# Patient Record
Sex: Male | Born: 1965 | Race: White | Hispanic: No | Smoking: Current every day smoker
Health system: Southern US, Community
[De-identification: ages and names within clinical notes are randomized; demographics above are authoritative.]

## PROBLEM LIST (undated history)

## (undated) DIAGNOSIS — G459 Transient cerebral ischemic attack, unspecified: Secondary | ICD-10-CM

## (undated) DIAGNOSIS — I499 Cardiac arrhythmia, unspecified: Secondary | ICD-10-CM

## (undated) DIAGNOSIS — F1021 Alcohol dependence, in remission: Secondary | ICD-10-CM

## (undated) DIAGNOSIS — I10 Essential (primary) hypertension: Secondary | ICD-10-CM

## (undated) DIAGNOSIS — K5792 Diverticulitis of intestine, part unspecified, without perforation or abscess without bleeding: Secondary | ICD-10-CM

## (undated) DIAGNOSIS — I639 Cerebral infarction, unspecified: Secondary | ICD-10-CM

## (undated) DIAGNOSIS — R569 Unspecified convulsions: Secondary | ICD-10-CM

## (undated) DIAGNOSIS — W3400XA Accidental discharge from unspecified firearms or gun, initial encounter: Secondary | ICD-10-CM

## (undated) DIAGNOSIS — R030 Elevated blood-pressure reading, without diagnosis of hypertension: Secondary | ICD-10-CM

## (undated) DIAGNOSIS — IMO0001 Reserved for inherently not codable concepts without codable children: Secondary | ICD-10-CM

## (undated) DIAGNOSIS — Z8489 Family history of other specified conditions: Secondary | ICD-10-CM

## (undated) HISTORY — PX: FINGER SURGERY: SHX640

## (undated) HISTORY — PX: OTHER SURGICAL HISTORY: SHX169

## (undated) HISTORY — DX: Diverticulitis of intestine, part unspecified, without perforation or abscess without bleeding: K57.92

## (undated) HISTORY — DX: Reserved for inherently not codable concepts without codable children: IMO0001

## (undated) HISTORY — PX: EYE SURGERY: SHX253

## (undated) HISTORY — DX: Transient cerebral ischemic attack, unspecified: G45.9

## (undated) HISTORY — DX: Elevated blood-pressure reading, without diagnosis of hypertension: R03.0

---

## 2004-11-27 ENCOUNTER — Ambulatory Visit (HOSPITAL_COMMUNITY): Admission: RE | Admit: 2004-11-27 | Discharge: 2004-11-27 | Payer: Self-pay | Admitting: Family Medicine

## 2010-04-06 ENCOUNTER — Ambulatory Visit (HOSPITAL_COMMUNITY)
Admission: RE | Admit: 2010-04-06 | Discharge: 2010-04-06 | Payer: Self-pay | Source: Home / Self Care | Attending: Family Medicine | Admitting: Family Medicine

## 2012-11-21 ENCOUNTER — Ambulatory Visit (INDEPENDENT_AMBULATORY_CARE_PROVIDER_SITE_OTHER): Payer: BC Managed Care – PPO | Admitting: Family Medicine

## 2012-11-21 ENCOUNTER — Encounter: Payer: Self-pay | Admitting: Family Medicine

## 2012-11-21 ENCOUNTER — Ambulatory Visit (INDEPENDENT_AMBULATORY_CARE_PROVIDER_SITE_OTHER): Payer: BC Managed Care – PPO

## 2012-11-21 VITALS — BP 148/95 | HR 73 | Temp 97.4°F | Ht 70.0 in | Wt 210.0 lb

## 2012-11-21 DIAGNOSIS — R5381 Other malaise: Secondary | ICD-10-CM

## 2012-11-21 DIAGNOSIS — R5383 Other fatigue: Secondary | ICD-10-CM

## 2012-11-21 LAB — CBC WITH DIFFERENTIAL/PLATELET
Basophils Absolute: 0 10*3/uL (ref 0.0–0.1)
Basophils Relative: 0 % (ref 0–1)
MCHC: 34.5 g/dL (ref 30.0–36.0)
Neutro Abs: 3.5 10*3/uL (ref 1.7–7.7)
Neutrophils Relative %: 53 % (ref 43–77)
RDW: 14.3 % (ref 11.5–15.5)

## 2012-11-21 NOTE — Progress Notes (Signed)
  Subjective:    Patient ID: Michael Herring, male    DOB: [birth year], 47 y.o.   MRN: [id number]  HPI  Pt presents today with chief complaint of fatigue.  Pt states that he has had generalzed fatigue over the last 3 months.  He reports that he started working a 3rd shift job about 9 months ago.  Pt was otherwise asymptomatic prior to this.  Sxs include generalized fatigue  Only sleeps 2-3 hours per night  Mild weakness Has also decreased libido  Has been under stress.  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 Says he can lose up to 5 pounds in 1 shift.  No night sweats per report.  No unintentional weight loss apart from work.  Smoke 1 1/2 PPD x 42 years. Minimal cough.  Drinks up to 3 beers 3 times per week.  No prior hx/o depression.  No illicit drug abuse.     Review of Systems  All other systems reviewed and are negative.       Objective:   Physical Exam  Constitutional: He appears well-developed.  Fatigued appearing    HENT:  Head: Normocephalic and atraumatic.  Right Ear: External ear normal.  Left Ear: External ear normal.  Eyes: Conjunctivae are normal. Pupils are equal, round, and reactive to light.  Neck: Normal range of motion.  Cardiovascular: Normal rate and regular rhythm.   Pulmonary/Chest: Effort normal and breath sounds normal.  Abdominal: Soft.  Musculoskeletal: Normal range of motion.  Lymphadenopathy:    He has no cervical adenopathy.  Neurological: He is alert.  Skin: Skin is warm.   [facility] reading (PRIMARY) by  Dr. Alvester Morin CXR preliminarily negative for any focal lesions. Mild bronchitic changes.                                          Assessment & Plan:  Other malaise and fatigue - Plan: CBC with Differential, TSH, POCT glycosylated hemoglobin (Hb A1C), Comprehensive metabolic panel, Vitamin D pnl(25-hydrxy+1,25-dihy)-bld, Vitamin B12, Testosterone, free, total, DG Chest 2 View, [place] spotted fvr abs pnl(IgG+IgM),  Sedimentation Rate, CK total and CKMB, B. burgdorfi antibodies, CANCELED: LYME IGG/IGM AB  DDx for sxs is fairly broad including sleep deprivation, vitamin deficiency, mood, inflammatory process, infection, malignancy. Will check labs as above. CXR preliminarily without any focal lesions. Will hold off on any particular treatment pending workup. Discussed general red flags.

## 2012-11-22 LAB — COMPREHENSIVE METABOLIC PANEL
ALT: 19 U/L (ref 0–53)
Alkaline Phosphatase: 59 U/L (ref 39–117)
Glucose, Bld: 84 mg/dL (ref 70–99)
Sodium: 139 mEq/L (ref 135–145)
Total Bilirubin: 0.4 mg/dL (ref 0.3–1.2)
Total Protein: 6.6 g/dL (ref 6.0–8.3)

## 2012-11-22 LAB — TESTOSTERONE, FREE, TOTAL, SHBG
Testosterone-% Free: 1.7 % (ref 1.6–2.9)
Testosterone: 645 ng/dL (ref 300–890)

## 2012-11-22 LAB — CK TOTAL AND CKMB (NOT AT ARMC)
Relative Index: 3.4 — ABNORMAL HIGH (ref 0.0–2.5)
Total CK: 122 U/L (ref 7–232)

## 2012-11-22 LAB — VITAMIN B12: Vitamin B-12: 218 pg/mL (ref 211–911)

## 2012-11-22 LAB — TSH: TSH: 2.29 u[IU]/mL (ref 0.350–4.500)

## 2012-11-23 LAB — B. BURGDORFI ANTIBODIES: B burgdorferi Ab IgG+IgM: 0.18 {ISR}

## 2012-11-24 LAB — VITAMIN D PNL(25-HYDRXY+1,25-DIHY)-BLD
Vit D, 25-Hydroxy: 35 ng/mL (ref 30–89)
Vitamin D3 1, 25 (OH)2: 53 pg/mL

## 2012-11-28 ENCOUNTER — Ambulatory Visit (INDEPENDENT_AMBULATORY_CARE_PROVIDER_SITE_OTHER): Payer: BC Managed Care – PPO | Admitting: Family Medicine

## 2012-11-28 ENCOUNTER — Encounter: Payer: Self-pay | Admitting: Family Medicine

## 2012-11-28 VITALS — BP 130/81 | HR 72 | Temp 99.3°F | Ht 70.0 in | Wt 211.0 lb

## 2012-11-28 DIAGNOSIS — G4726 Circadian rhythm sleep disorder, shift work type: Secondary | ICD-10-CM

## 2012-11-28 DIAGNOSIS — R5381 Other malaise: Secondary | ICD-10-CM

## 2012-11-28 DIAGNOSIS — N419 Inflammatory disease of prostate, unspecified: Secondary | ICD-10-CM

## 2012-11-28 DIAGNOSIS — N529 Male erectile dysfunction, unspecified: Secondary | ICD-10-CM

## 2012-11-28 DIAGNOSIS — R5383 Other fatigue: Secondary | ICD-10-CM

## 2012-11-28 MED ORDER — SILDENAFIL CITRATE 100 MG PO TABS: 50.0000 mg | ORAL_TABLET | Freq: Every day | ORAL | Status: DC | PRN

## 2012-11-28 MED ORDER — CIPROFLOXACIN HCL 500 MG PO TABS: 500.0000 mg | ORAL_TABLET | Freq: Two times a day (BID) | ORAL | Status: DC

## 2012-11-28 NOTE — Progress Notes (Signed)
  Subjective:    Patient ID: Michael Herring, male    DOB: 05-04-1965, 47 y.o.   MRN: [id number]  HPI Patient presents today for followup of fatigue. Patient seen last week for persistent fatigue over the past 9 months. Patient had a fairly extensive workup performed with blood work including TSH, cemented, vitamin B12 and vitamin D levels. Reck you Monospot a fever as well as Lyme titers also performed given body aches. Sedimentation rate CK level also check. Laboratories essentially negative. Predominant issue has been the patient started a third shift job about 9 months ago has had significant problems with sleep since this point. Patient has only slept 2-4 hours per day and feels overall very fatigue. This is also affecting his sex life. Patient is a somewhat difficult time maintaining erection since he started his nocturnal job. Patient denies any true mood issues. Patient denies any issue depression. No prior history of mood issues.   Patient also reports having one episode of hematospermia over the weekend. No back pain, no fever, no dysuria. Patient states this is never happened before.   Review of Systems  All other systems reviewed and are negative.       Objective:   Physical Exam  Constitutional: He appears well-developed and well-nourished.  HENT:  Head: Normocephalic and atraumatic.  Eyes: Conjunctivae are normal. Pupils are equal, round, and reactive to light.  Neck: Normal range of motion.  Cardiovascular: Normal rate and regular rhythm.   Pulmonary/Chest: Effort normal and breath sounds normal.  Abdominal: Soft.  Genitourinary:  + prostatic tenderness   Musculoskeletal: Normal range of motion.  Neurological: He is alert.  Skin: Skin is warm.          Assessment & Plan:  Prostatitis - Plan: ciprofloxacin (CIPRO) 500 MG tablet, PSA, total and free, Urine culture  Erectile dysfunction - Plan: sildenafil (VIAGRA) 100 MG tablet  Shift work sleep  disorder  Fatigue  Had a relatively lengthy discussion with patient about treatment options. Symptoms seem to be more so related to shift work related sleep issues versus mood. Workup including multiple labs are within normal limits. Will place patient on course of melatonin for the next 2 weeks to see this improves symptoms. Discussed that if symptoms do not improve with melatonin may consider prescription medications such as trazodone or Remeron. Patient's PHQ 9 is around 10 today. Symptoms seem to be predominantly related to sleep and fatigue. Do not feel symptoms are related to depression.  When necessary Viagra prescription also given. Testosterone level within normal limits recently. No sexual dysfunction issues prior to starting third shift job. Suspect that erectile dysfunction issues are more so related to fatigue and sleep disorder. Patient will like a prescription just in case.  Start patient on extended course of Cipro for prostatitis. Will check PSA as well as a urine culture. Discussed GU red flags.   Follow up as needed.

## 2012-11-28 NOTE — Patient Instructions (Addendum)
Prostatitis The prostate gland is about the size and shape of a walnut. It is located just below your bladder. It produces one of the components of semen, which is made up of sperm and the fluids that help nourish and transport it out from the testicles. Prostatitis is redness, soreness, and swelling (inflammation) of the prostate gland.  There are 3 types of prostatitis:  Acute bacterial prostatitis This is the least common type of prostatitis. It starts quickly and usually leads to a bladder infection. It can occur at any age.  Chronic bacterial prostatitis This is a persistent bacterial infection in the prostate. It usually develops from repeated acute bacterial prostatitis or acute bacterial prostatitis that was not properly treated. It can occur in men of any age but is most common in middle-aged men whose prostate has begun to enlarge.   Chronic prostatitis chronic pelvic pain syndrome This is the most common type of prostatitis. It is inflammation of the prostate gland that is not caused by a bacterial infection. The cause is unknown. CAUSES The cause of acute and chronic bacterial prostatitis is a bacterial infection. The exact cause of chronic prostatitis and chronic pelvic pain syndrome and asymptomatic inflammatory prostatitis is unknown.  SYMPTOMS  Symptoms can vary depending upon the type of prostatitis that exists. There can also be overlap in symptoms. Possible symptoms for each type of prostatitis are listed below. Acute bacterial prostatitis  Painful urination.  Fever or chills.  Muscle or joint pains.  Low back pain.  Low abdominal pain.  Inability to empty bladder completely.  Sudden urge to urinate.  Frequent urination.  Difficulty starting urine stream.  Weak urine stream.  Discharge from the urethra.  Dribbling after urination.  Rectal pain.  Pain in the testicles, penis, or tip of the penis.  Pain in the space between the anus and scrotum  (perineum).  Problems with sexual function.  Painful ejaculation.  Bloody semen. Chronic bacterial prostatitis  The symptoms are similar to those of acute bacterial prostatitis, but they usually are much less severe. Fever, chills, and muscle and joint pain are not associated with chronic bacterial prostatitis. Chronic prostatitis chronic pelvic pain syndrome  Symptoms typically include a dull ache in the scrotum and the perineum. DIAGNOSIS  In order to diagnose prostatitis, your caregiver will ask about your symptoms. If acute or chronic bacterial prostatitis is suspected, a urine sample will be taken and tested (urinalysis). This is to see if there is bacteria in your urine. If the urinalysis result is negative for bacteria, your caregiver may use a finger to feel your prostate (digital rectal exam). This exam helps your caregiver determine if your prostate is swollen and tender. TREATMENT  Treatment for prostatitis depends on the cause. If a bacterial infection is the cause, it can be treated with antibiotic medicine. In cases of chronic bacterial prostatitis, the use of antibiotics for up to 1 month may be necessary. Your caregiver may instruct you to take sitz baths to help relieve pain. A sitz bath is a bath of hot water in which your hips and buttocks are under water. HOME CARE INSTRUCTIONS   Take all medicines as directed by your caregiver.  Take sitz baths as directed by your caregiver. SEEK MEDICAL CARE IF:   Your symptoms get worse, not better.  You have a fever. SEEK IMMEDIATE MEDICAL CARE IF:   You have chills.  You feel nauseous or vomit.  You feel lightheaded or faint.  You are unable to  urinate.  You have blood or blood clots in your urine. Document Released: 04/16/2000 Document Revised: 07/12/2011 Document Reviewed: 03/22/2011 [facility] Patient Information 2014 [place], [place].   Purchase melatonin over-the-counter to help with sleep.

## 2012-11-29 ENCOUNTER — Telehealth: Payer: Self-pay | Admitting: Family Medicine

## 2012-11-29 LAB — URINE CULTURE

## 2012-11-29 LAB — PSA, TOTAL AND FREE: PSA: 0.4 ng/mL (ref 0.0–4.0)

## 2012-12-07 ENCOUNTER — Encounter: Payer: Self-pay | Admitting: *Deleted

## 2012-12-27 ENCOUNTER — Other Ambulatory Visit: Payer: Self-pay

## 2012-12-27 ENCOUNTER — Emergency Department (HOSPITAL_COMMUNITY)
Admission: EM | Admit: 2012-12-27 | Discharge: 2012-12-27 | Disposition: A | Discharge status: Home / Self Care | Payer: BC Managed Care – PPO | Attending: Emergency Medicine | Admitting: Emergency Medicine

## 2012-12-27 ENCOUNTER — Encounter (HOSPITAL_COMMUNITY): Payer: Self-pay | Admitting: *Deleted

## 2012-12-27 ENCOUNTER — Emergency Department (HOSPITAL_COMMUNITY): Payer: BC Managed Care – PPO

## 2012-12-27 DIAGNOSIS — R05 Cough: Secondary | ICD-10-CM | POA: Insufficient documentation

## 2012-12-27 DIAGNOSIS — R42 Dizziness and giddiness: Secondary | ICD-10-CM | POA: Insufficient documentation

## 2012-12-27 DIAGNOSIS — R059 Cough, unspecified: Secondary | ICD-10-CM | POA: Insufficient documentation

## 2012-12-27 DIAGNOSIS — R079 Chest pain, unspecified: Secondary | ICD-10-CM

## 2012-12-27 DIAGNOSIS — F17200 Nicotine dependence, unspecified, uncomplicated: Secondary | ICD-10-CM | POA: Insufficient documentation

## 2012-12-27 LAB — CBC WITH DIFFERENTIAL/PLATELET
Basophils Absolute: 0 10*3/uL (ref 0.0–0.1)
Basophils Relative: 0 % (ref 0–1)
Eosinophils Relative: 3 % (ref 0–5)
HCT: 43.9 % (ref 39.0–52.0)
Hemoglobin: 15.7 g/dL (ref 13.0–17.0)
MCH: 31.4 pg (ref 26.0–34.0)
MCHC: 35.8 g/dL (ref 30.0–36.0)
MCV: 87.8 fL (ref 78.0–100.0)
Monocytes Absolute: 0.8 10*3/uL (ref 0.1–1.0)
Monocytes Relative: 10 % (ref 3–12)
RDW: 13 % (ref 11.5–15.5)

## 2012-12-27 LAB — BASIC METABOLIC PANEL
BUN: 11 mg/dL (ref 6–23)
CO2: 25 mEq/L (ref 19–32)
Chloride: 104 mEq/L (ref 96–112)
Creatinine, Ser: 0.86 mg/dL (ref 0.50–1.35)
GFR calc Af Amer: >90 mL/min (ref >90)
Glucose, Bld: 114 mg/dL — ABNORMAL HIGH (ref 70–99)

## 2012-12-27 MED ORDER — NITROGLYCERIN 0.4 MG SL SUBL
0.4000 mg | SUBLINGUAL_TABLET | Freq: Once | SUBLINGUAL | Status: AC
  Administered 2012-12-27: 0.4 mg via SUBLINGUAL

## 2012-12-27 MED ORDER — OXYCODONE-ACETAMINOPHEN 5-325 MG PO TABS
2.0000 | ORAL_TABLET | Freq: Once | ORAL | Status: AC
  Administered 2012-12-27: 2 via ORAL
  Filled 2012-12-27: qty 2

## 2012-12-27 NOTE — ED Notes (Addendum)
PAIN - wants something for headache.  Chest pain much better after NTG x 2

## 2012-12-27 NOTE — ED Notes (Signed)
Pt reporting pain in center of chest moving into back and left shoulder.  Reports coughing, but no vomiting.  Denies SOB.

## 2012-12-27 NOTE — ED Notes (Signed)
Took two aspirin (adult in package at work) around 02:00 - 02:20 am while at work.

## 2012-12-27 NOTE — ED Notes (Signed)
Patient reports about 20 minutes after pain began he started sweating and felt a little dizzy.  On arrival to ED, skin warm and dry.  Pain is still to left of sternum, patient presses on area to "try to make it feel better", movement does not increase pain.

## 2012-12-27 NOTE — ED Notes (Signed)
Up to void - now states he feels a soreness in area where pain was earlier.
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
## 2013-01-03 ENCOUNTER — Emergency Department (HOSPITAL_COMMUNITY)
Admission: EM | Admit: 2013-01-03 | Discharge: 2013-01-03 | Disposition: A | Discharge status: Home / Self Care | Payer: Worker's Compensation | Attending: Emergency Medicine | Admitting: Emergency Medicine

## 2013-01-03 ENCOUNTER — Encounter (HOSPITAL_COMMUNITY): Payer: Self-pay | Admitting: *Deleted

## 2013-01-03 DIAGNOSIS — W230XXA Caught, crushed, jammed, or pinched between moving objects, initial encounter: Secondary | ICD-10-CM | POA: Insufficient documentation

## 2013-01-03 DIAGNOSIS — S61409A Unspecified open wound of unspecified hand, initial encounter: Secondary | ICD-10-CM | POA: Insufficient documentation

## 2013-01-03 DIAGNOSIS — Y929 Unspecified place or not applicable: Secondary | ICD-10-CM | POA: Insufficient documentation

## 2013-01-03 DIAGNOSIS — Y9389 Activity, other specified: Secondary | ICD-10-CM | POA: Insufficient documentation

## 2013-01-03 DIAGNOSIS — F17200 Nicotine dependence, unspecified, uncomplicated: Secondary | ICD-10-CM | POA: Insufficient documentation

## 2013-01-03 DIAGNOSIS — Y990 Civilian activity done for income or pay: Secondary | ICD-10-CM | POA: Insufficient documentation

## 2013-01-03 DIAGNOSIS — IMO0002 Reserved for concepts with insufficient information to code with codable children: Secondary | ICD-10-CM

## 2013-01-03 DIAGNOSIS — Z792 Long term (current) use of antibiotics: Secondary | ICD-10-CM | POA: Insufficient documentation

## 2013-01-03 MED ORDER — BACITRACIN-NEOMYCIN-POLYMYXIN 400-5-5000 EX OINT
TOPICAL_OINTMENT | CUTANEOUS | Status: AC
  Administered 2013-01-03: 03:00:00
  Filled 2013-01-03: qty 1

## 2013-01-03 MED ORDER — LIDOCAINE HCL (PF) 1 % IJ SOLN
INTRAMUSCULAR | Status: AC
  Filled 2013-01-03: qty 5

## 2013-01-03 NOTE — ED Notes (Signed)
Lac noted in between fourth & fifth finger. Bleeding controlled.

## 2013-01-03 NOTE — ED Notes (Signed)
Pt working on a machine at work & raised his right hand up into a razor blade.

## 2013-01-03 NOTE — ED Notes (Signed)
Pt carried to lab for UDS. Lab aware of the need for the UDS.

## 2013-01-03 NOTE — ED Provider Notes (Signed)
TIME SEEN: 2:34 AM  CHIEF COMPLAINT: Right hand laceration  HPI: Patient is a 47 y.o. male with no significant past medical history presents emergency department with a laceration to his right dorsal hand. He reports he was at work tonight when it got caught in machine. Denies any other injury. States his last tetanus vaccination was 2 years ago. No numbness, tingling or focal weakness. He is right-hand dominant.  ROS: See HPI Constitutional: no fever  Eyes: no drainage  ENT: no runny nose   Cardiovascular:  no chest pain  Resp: no SOB  GI: no vomiting GU: no dysuria Integumentary: no rash  Allergy: no hives  Musculoskeletal: no leg swelling  Neurological: no slurred speech ROS otherwise negative  PAST MEDICAL HISTORY/PAST SURGICAL HISTORY:  No past medical history on file.  MEDICATIONS:  Prior to Admission medications   Medication Sig Start Date End Date Taking? Authorizing Provider  ciprofloxacin (CIPRO) 500 MG tablet Take 1 tablet (500 mg total) by mouth 2 (two) times daily. 11/28/12   Doree Albee, MD  sildenafil (VIAGRA) 100 MG tablet Take 0.5-1 tablets (50-100 mg total) by mouth daily as needed for erectile dysfunction. 11/28/12   Doree Albee, MD    ALLERGIES:  No Known Allergies  SOCIAL HISTORY:  History  Substance Use Topics  . Smoking status: Current Every Day Smoker -- 1.50 packs/day for 28 years    Types: Cigarettes  . Smokeless tobacco: Not on file  . Alcohol Use: 3.6 oz/week    6 Cans of beer per week    FAMILY HISTORY: Family History  Problem Relation Age of Onset  . Hyperlipidemia Father   . COPD Father     EXAM: There were no vitals taken for this visit. CONSTITUTIONAL: Alert and oriented and responds appropriately to questions. Well-appearing; well-nourished; GCS 15 HEAD: Normocephalic; atraumatic EYES: Conjunctivae clear, PERRL, EOMI ENT: normal nose; no rhinorrhea; moist mucous membranes; pharynx without lesions noted; no dental injury; no  hemotypanum; no septal hematoma NECK: Supple, no meningismus, no LAD; no midline spinal tenderness, step-off or deformity CARD: RRR; S1 and S2 appreciated; no murmurs, no clicks, no rubs, no gallops RESP: Normal chest excursion without splinting or tachypnea; breath sounds clear and equal bilaterally; no wheezes, no rhonchi, no rales; chest wall stable, nontender to palpation ABD/GI: Normal bowel sounds; non-distended; soft, non-tender, no rebound, no guarding PELVIS:  stable, nontender to palpation BACK:  The back appears normal and is non-tender to palpation, there is no CVA tenderness; no midline spinal tenderness, step-off or deformity EXT: Patient has a 2.5 cm superficial laceration to the dorsal hand between the fourth and fifth digits, no tendon involvement, sensation to light touch intact diffusely, 2+ radial pulses bilaterally, normal capillary refill ; Normal ROM in all joints; non-tender to palpation; no edema; normal capillary refill; no cyanosis    SKIN: Normal color for age and race; warm NEURO: Moves all extremities equally PSYCH: The patient's mood and manner are appropriate. Grooming and personal hygiene are appropriate.  MEDICAL DECISION MAKING: Patient with superficial laceration. I repeated wound and repaired with one 5.0 Prolene simple interrupted suture. Patient's tetanus is up-to-date. Applied sterile dressing and bacitracin ointment. We'll give return precautions and PCP followup. Patient verbalizes understanding is comfortable plan.    LACERATION REPAIR Performed by: Raelyn Number Authorized by: Raelyn Number Consent: Verbal consent obtained. Risks and benefits: risks, benefits and alternatives were discussed Consent given by: patient Patient identity confirmed: provided demographic data Prepped and Draped in normal  sterile fashion Wound explored  Laceration Location: Dorsal right hand  Laceration Length: 2.5 cm  No Foreign Bodies seen or  palpated  Anesthesia: None   Local anesthetic: None   Anesthetic total: 0 ml  Irrigation method: syringe Amount of cleaning: standard  Skin closure: Simple, 5.0 Prolene   Number of sutures: 1 simple interrupted   Technique: Wound was irrigated with saline. Area cleansed with Betadine and prepped with sterile dressing. One 5.0 Prolene interrupted suture placed. Estrace ointment applied and sterile dressing applied.   Patient tolerance: Patient tolerated the procedure well with no immediate complications.  Layla Maw Ward, DO 01/03/13 0302

## 2014-02-11 ENCOUNTER — Encounter: Payer: Self-pay | Admitting: Family Medicine

## 2014-02-11 ENCOUNTER — Telehealth: Payer: Self-pay | Admitting: Family Medicine

## 2014-02-11 ENCOUNTER — Ambulatory Visit (INDEPENDENT_AMBULATORY_CARE_PROVIDER_SITE_OTHER): Payer: BC Managed Care – PPO | Admitting: Family Medicine

## 2014-02-11 ENCOUNTER — Encounter: Payer: Self-pay | Admitting: *Deleted

## 2014-02-11 VITALS — BP 134/88 | HR 80 | Temp 97.9°F | Ht 70.0 in | Wt 219.0 lb

## 2014-02-11 DIAGNOSIS — J029 Acute pharyngitis, unspecified: Secondary | ICD-10-CM

## 2014-02-11 DIAGNOSIS — R52 Pain, unspecified: Secondary | ICD-10-CM

## 2014-02-11 DIAGNOSIS — R059 Cough, unspecified: Secondary | ICD-10-CM

## 2014-02-11 DIAGNOSIS — R0989 Other specified symptoms and signs involving the circulatory and respiratory systems: Secondary | ICD-10-CM

## 2014-02-11 DIAGNOSIS — R509 Fever, unspecified: Secondary | ICD-10-CM

## 2014-02-11 DIAGNOSIS — R05 Cough: Secondary | ICD-10-CM

## 2014-02-11 DIAGNOSIS — J208 Acute bronchitis due to other specified organisms: Secondary | ICD-10-CM

## 2014-02-11 LAB — POCT INFLUENZA A/B
Influenza A, POC: NEGATIVE
Influenza B, POC: NEGATIVE

## 2014-02-11 LAB — POCT RAPID STREP A (OFFICE): Rapid Strep A Screen: NEGATIVE

## 2014-02-11 MED ORDER — ALBUTEROL SULFATE HFA 108 (90 BASE) MCG/ACT IN AERS: 2.0000 | INHALATION_SPRAY | Freq: Four times a day (QID) | RESPIRATORY_TRACT | Status: DC | PRN

## 2014-02-11 MED ORDER — METHYLPREDNISOLONE ACETATE 80 MG/ML IJ SUSP
80.0000 mg | Freq: Once | INTRAMUSCULAR | Status: AC
  Administered 2014-02-11: 80 mg via INTRAMUSCULAR

## 2014-02-11 MED ORDER — HYDROCODONE-HOMATROPINE 5-1.5 MG/5ML PO SYRP: 5.0000 mL | ORAL_SOLUTION | Freq: Three times a day (TID) | ORAL | Status: DC | PRN

## 2014-02-11 MED ORDER — AZITHROMYCIN 250 MG PO TABS: ORAL_TABLET | ORAL | Status: DC

## 2014-02-11 NOTE — Progress Notes (Signed)
   Subjective:    Patient ID: Michael Herring, male    DOB: 1965/06/01, 48 y.o.   MRN: [id number]  HPI This 49 y.o. male presents for evaluation of URI sx's.  He has a persistent cough.  He has been hurting in the chest due to coughing so much.  He is a smoker.   Review of Systems C/o cough and uri sx's No chest pain, SOB, HA, dizziness, vision change, N/V, diarrhea, constipation, dysuria, urinary urgency or frequency, myalgias, arthralgias or rash.     Objective:   Physical Exam  Vital signs noted  Well developed well nourished male.  HEENT - Head atraumatic Normocephalic                Eyes - PERRLA, Conjuctiva - clear Sclera- Clear EOMI                Ears - EAC's Wnl TM's Wnl Gross Hearing WNL                Nose - Nares patent                 Throat - oropharanx wnl Respiratory - Lungs CTA bilateral Cardiac - RRR S1 and S2 without murmur GI - Abdomen soft Nontender and bowel sounds active x 4 Extremities - No edema. Neuro - Grossly intact.      Assessment & Plan:  Cough - Plan: POCT Influenza A/B  Chest congestion - Plan: POCT Influenza A/B  Body aches - Plan: POCT Influenza A/B, POCT rapid strep A  Sore throat - Plan: POCT rapid strep A  Fever and chills - Plan: POCT Influenza A/B, POCT rapid strep A  Acute bronchitis due to other specified organisms - Plan: azithromycin (ZITHROMAX) 250 MG tablet, HYDROcodone-homatropine (HYCODAN) 5-1.5 MG/5ML syrup, albuterol (PROVENTIL HFA;VENTOLIN HFA) 108 (90 BASE) MCG/ACT inhaler, methylPREDNISolone acetate (DEPO-MEDROL) injection 80 mg  Push po fluids, rest, tylenol and motrin otc prn as directed for fever, arthralgias, and myalgias.  Follow up prn if sx's continue or persist.  Lysbeth Penner FNP

## 2014-10-16 ENCOUNTER — Encounter (INDEPENDENT_AMBULATORY_CARE_PROVIDER_SITE_OTHER): Payer: Self-pay

## 2014-10-16 ENCOUNTER — Ambulatory Visit (INDEPENDENT_AMBULATORY_CARE_PROVIDER_SITE_OTHER): Payer: BLUE CROSS/BLUE SHIELD | Admitting: Physician Assistant

## 2014-10-16 ENCOUNTER — Encounter: Payer: Self-pay | Admitting: Physician Assistant

## 2014-10-16 VITALS — BP 147/97 | HR 81 | Temp 97.1°F | Ht 70.0 in | Wt 226.8 lb

## 2014-10-16 DIAGNOSIS — M25562 Pain in left knee: Secondary | ICD-10-CM

## 2014-10-16 NOTE — Progress Notes (Signed)
Subjective:     Patient ID: Michael Herring, male   DOB: 1965/10/09, 49 y.o.   MRN: [id number]  HPI Pt was coming down the steps when he missed the last one He fell forward unable to catch himself He came down with the L knee on a railroad tie There was  some pain to the L ankle that has improved Pain is now mostly to the lateral L knee He denies any locking or giving way of the knee Using heat and OTC NSAIDS for sx relief   Review of Systems     Objective:   Physical Exam No ecchy/edema to the knee No effusion noted FROM of the L knee with + patellar click No TTP of the lateral joint or with patellar compression + TTP of the lateral femoral condyle Lachman/[place] negative Good strength distal Pulses/sensory good distal     Assessment:     L knee pain    Plan:     Continue wit heat/Ice OTC NSAIDS Work note written F/U if sx continue and Xray at that time

## 2014-10-16 NOTE — Patient Instructions (Signed)

## 2014-10-24 ENCOUNTER — Telehealth: Payer: Self-pay | Admitting: *Deleted

## 2014-10-24 NOTE — Telephone Encounter (Signed)
Have you seen these forms? 

## 2014-10-24 NOTE — Telephone Encounter (Signed)
Patient is calling to check on the status of his FMLA papers.  He is asking to speak with [facility] regarding the papers.

## 2015-01-27 ENCOUNTER — Observation Stay (HOSPITAL_COMMUNITY): Payer: BLUE CROSS/BLUE SHIELD

## 2015-01-27 ENCOUNTER — Encounter (HOSPITAL_COMMUNITY): Payer: Self-pay | Admitting: *Deleted

## 2015-01-27 ENCOUNTER — Observation Stay (HOSPITAL_COMMUNITY)
Admission: EM | Admit: 2015-01-27 | Discharge: 2015-01-28 | Disposition: A | Discharge status: Home / Self Care | Payer: BLUE CROSS/BLUE SHIELD | Attending: Internal Medicine | Admitting: Internal Medicine

## 2015-01-27 ENCOUNTER — Emergency Department (HOSPITAL_COMMUNITY): Payer: BLUE CROSS/BLUE SHIELD

## 2015-01-27 DIAGNOSIS — Z720 Tobacco use: Secondary | ICD-10-CM | POA: Diagnosis not present

## 2015-01-27 DIAGNOSIS — Z79899 Other long term (current) drug therapy: Secondary | ICD-10-CM | POA: Diagnosis not present

## 2015-01-27 DIAGNOSIS — G459 Transient cerebral ischemic attack, unspecified: Secondary | ICD-10-CM | POA: Diagnosis not present

## 2015-01-27 DIAGNOSIS — J208 Acute bronchitis due to other specified organisms: Secondary | ICD-10-CM

## 2015-01-27 DIAGNOSIS — R0781 Pleurodynia: Secondary | ICD-10-CM | POA: Diagnosis present

## 2015-01-27 DIAGNOSIS — F17200 Nicotine dependence, unspecified, uncomplicated: Secondary | ICD-10-CM | POA: Diagnosis present

## 2015-01-27 DIAGNOSIS — R079 Chest pain, unspecified: Secondary | ICD-10-CM | POA: Diagnosis present

## 2015-01-27 DIAGNOSIS — R0789 Other chest pain: Principal | ICD-10-CM | POA: Insufficient documentation

## 2015-01-27 DIAGNOSIS — J209 Acute bronchitis, unspecified: Secondary | ICD-10-CM | POA: Diagnosis not present

## 2015-01-27 LAB — BASIC METABOLIC PANEL
ANION GAP: 6 (ref 5–15)
BUN: 11 mg/dL (ref 6–20)
CHLORIDE: 107 mmol/L (ref 101–111)
CO2: 24 mmol/L (ref 22–32)
Calcium: 8.6 mg/dL — ABNORMAL LOW (ref 8.9–10.3)
Creatinine, Ser: 0.98 mg/dL (ref 0.61–1.24)
GFR calc non Af Amer: >60 mL/min (ref >60)
Glucose, Bld: 117 mg/dL — ABNORMAL HIGH (ref 65–99)
POTASSIUM: 3.6 mmol/L (ref 3.5–5.1)
Sodium: 137 mmol/L (ref 135–145)

## 2015-01-27 LAB — CBC WITH DIFFERENTIAL/PLATELET
BASOS ABS: 0 10*3/uL (ref 0.0–0.1)
Basophils Relative: 1 %
Eosinophils Absolute: 0.3 10*3/uL (ref 0.0–0.7)
Eosinophils Relative: 4 %
HEMATOCRIT: 45.5 % (ref 39.0–52.0)
HEMOGLOBIN: 15.9 g/dL (ref 13.0–17.0)
LYMPHS PCT: 21 %
Lymphs Abs: 1.5 10*3/uL (ref 0.7–4.0)
MCH: 31.1 pg (ref 26.0–34.0)
MCHC: 34.9 g/dL (ref 30.0–36.0)
MCV: 88.9 fL (ref 78.0–100.0)
MONO ABS: 1 10*3/uL (ref 0.1–1.0)
Monocytes Relative: 13 %
NEUTROS ABS: 4.3 10*3/uL (ref 1.7–7.7)
NEUTROS PCT: 61 %
Platelets: 179 10*3/uL (ref 150–400)
RBC: 5.12 MIL/uL (ref 4.22–5.81)
RDW: 12.8 % (ref 11.5–15.5)
WBC: 7.1 10*3/uL (ref 4.0–10.5)

## 2015-01-27 LAB — APTT: APTT: 26 s (ref 24–37)

## 2015-01-27 LAB — TROPONIN I: Troponin I: <0.03 ng/mL (ref <0.031)

## 2015-01-27 LAB — I-STAT CHEM 8, ED
BUN: 10 mg/dL (ref 6–20)
CREATININE: 0.9 mg/dL (ref 0.61–1.24)
Calcium, Ion: 1.08 mmol/L — ABNORMAL LOW (ref 1.12–1.23)
Chloride: 105 mmol/L (ref 101–111)
Glucose, Bld: 114 mg/dL — ABNORMAL HIGH (ref 65–99)
HEMATOCRIT: 48 % (ref 39.0–52.0)
HEMOGLOBIN: 16.3 g/dL (ref 13.0–17.0)
POTASSIUM: 3.7 mmol/L (ref 3.5–5.1)
SODIUM: 139 mmol/L (ref 135–145)
TCO2: 22 mmol/L (ref 0–100)

## 2015-01-27 LAB — I-STAT TROPONIN, ED: Troponin i, poc: 0 ng/mL (ref 0.00–0.08)

## 2015-01-27 LAB — PROTIME-INR
INR: 0.95 (ref 0.00–1.49)
PROTHROMBIN TIME: 12.9 s (ref 11.6–15.2)

## 2015-01-27 LAB — ETHANOL: Alcohol, Ethyl (B): 6 mg/dL — ABNORMAL HIGH (ref <5)

## 2015-01-27 MED ORDER — GUAIFENESIN-DM 100-10 MG/5ML PO SYRP
15.0000 mL | ORAL_SOLUTION | ORAL | Status: DC | PRN
  Administered 2015-01-27 – 2015-01-28 (×3): 15 mL via ORAL
  Filled 2015-01-27 (×4): qty 15

## 2015-01-27 MED ORDER — HYDROCODONE-HOMATROPINE 5-1.5 MG/5ML PO SYRP
5.0000 mL | ORAL_SOLUTION | ORAL | Status: DC | PRN
  Administered 2015-01-27 – 2015-01-28 (×3): 5 mL via ORAL
  Filled 2015-01-27 (×4): qty 5

## 2015-01-27 MED ORDER — ASPIRIN 325 MG PO TABS
325.0000 mg | ORAL_TABLET | Freq: Every day | ORAL | Status: DC
  Administered 2015-01-27 – 2015-01-28 (×2): 325 mg via ORAL
  Filled 2015-01-27 (×2): qty 1

## 2015-01-27 MED ORDER — ONDANSETRON HCL 4 MG/2ML IJ SOLN
4.0000 mg | Freq: Four times a day (QID) | INTRAMUSCULAR | Status: DC | PRN
  Administered 2015-01-27: 4 mg via INTRAVENOUS
  Filled 2015-01-27: qty 2

## 2015-01-27 MED ORDER — ALBUTEROL SULFATE (2.5 MG/3ML) 0.083% IN NEBU: 2.5000 mg | INHALATION_SOLUTION | RESPIRATORY_TRACT | Status: DC | PRN

## 2015-01-27 MED ORDER — ASPIRIN 300 MG RE SUPP
300.0000 mg | Freq: Every day | RECTAL | Status: DC
  Filled 2015-01-27 (×3): qty 1

## 2015-01-27 MED ORDER — IBUPROFEN 800 MG PO TABS
800.0000 mg | ORAL_TABLET | Freq: Three times a day (TID) | ORAL | Status: DC | PRN
  Administered 2015-01-27 – 2015-01-28 (×2): 800 mg via ORAL
  Filled 2015-01-27 (×2): qty 1

## 2015-01-27 MED ORDER — MORPHINE SULFATE (PF) 2 MG/ML IV SOLN
1.0000 mg | INTRAVENOUS | Status: DC | PRN
  Administered 2015-01-27: 1 mg via INTRAVENOUS
  Administered 2015-01-28: 2 mg via INTRAVENOUS
  Filled 2015-01-27 (×2): qty 1

## 2015-01-27 MED ORDER — STROKE: EARLY STAGES OF RECOVERY BOOK
Freq: Once | Status: AC
  Administered 2015-01-27: 1
  Filled 2015-01-27: qty 1

## 2015-01-27 MED ORDER — SODIUM CHLORIDE 0.9 % IV SOLN
INTRAVENOUS | Status: DC
  Administered 2015-01-27 – 2015-01-28 (×2): via INTRAVENOUS

## 2015-01-27 MED ORDER — GUAIFENESIN ER 600 MG PO TB12
1200.0000 mg | ORAL_TABLET | Freq: Two times a day (BID) | ORAL | Status: DC
  Administered 2015-01-27 – 2015-01-28 (×2): 1200 mg via ORAL
  Filled 2015-01-27 (×2): qty 2

## 2015-01-27 MED ORDER — SENNOSIDES-DOCUSATE SODIUM 8.6-50 MG PO TABS: 1.0000 | ORAL_TABLET | Freq: Every evening | ORAL | Status: DC | PRN

## 2015-01-27 MED ORDER — MORPHINE SULFATE (PF) 2 MG/ML IV SOLN
1.0000 mg | INTRAVENOUS | Status: DC | PRN
  Administered 2015-01-27: 1 mg via INTRAVENOUS
  Filled 2015-01-27: qty 1

## 2015-01-27 MED ORDER — ACETAMINOPHEN 325 MG PO TABS: 650.0000 mg | ORAL_TABLET | ORAL | Status: DC | PRN

## 2015-01-27 MED ORDER — ENOXAPARIN SODIUM 40 MG/0.4ML ~~LOC~~ SOLN
40.0000 mg | SUBCUTANEOUS | Status: DC
  Administered 2015-01-27: 40 mg via SUBCUTANEOUS
  Filled 2015-01-27: qty 0.4

## 2015-01-27 MED ORDER — BENZONATATE 100 MG PO CAPS
200.0000 mg | ORAL_CAPSULE | Freq: Three times a day (TID) | ORAL | Status: DC | PRN
  Administered 2015-01-28: 200 mg via ORAL
  Filled 2015-01-27: qty 2

## 2015-01-27 MED ORDER — ACETAMINOPHEN 650 MG RE SUPP: 650.0000 mg | RECTAL | Status: DC | PRN

## 2015-01-27 NOTE — ED Notes (Signed)
Pt with left sided chest pain sharp in nature since Friday, noted coughing while triage, pt states with SOB, denies N/V, + some dizziness
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
## 2015-01-28 ENCOUNTER — Observation Stay (HOSPITAL_BASED_OUTPATIENT_CLINIC_OR_DEPARTMENT_OTHER): Payer: BLUE CROSS/BLUE SHIELD

## 2015-01-28 ENCOUNTER — Encounter (HOSPITAL_COMMUNITY): Payer: Self-pay

## 2015-01-28 ENCOUNTER — Observation Stay (HOSPITAL_COMMUNITY): Payer: BLUE CROSS/BLUE SHIELD

## 2015-01-28 DIAGNOSIS — G459 Transient cerebral ischemic attack, unspecified: Secondary | ICD-10-CM

## 2015-01-28 DIAGNOSIS — J209 Acute bronchitis, unspecified: Secondary | ICD-10-CM | POA: Diagnosis not present

## 2015-01-28 DIAGNOSIS — R0781 Pleurodynia: Secondary | ICD-10-CM | POA: Diagnosis not present

## 2015-01-28 DIAGNOSIS — Z720 Tobacco use: Secondary | ICD-10-CM | POA: Diagnosis not present

## 2015-01-28 LAB — LIPID PANEL
CHOL/HDL RATIO: 2.6 ratio
CHOLESTEROL: 68 mg/dL (ref 0–200)
HDL: 26 mg/dL — ABNORMAL LOW (ref >40)
LDL Cholesterol: 31 mg/dL (ref 0–99)
Triglycerides: 53 mg/dL (ref <150)
VLDL: 11 mg/dL (ref 0–40)

## 2015-01-28 MED ORDER — BENZONATATE 200 MG PO CAPS: 200.0000 mg | ORAL_CAPSULE | Freq: Three times a day (TID) | ORAL | Status: DC | PRN

## 2015-01-28 MED ORDER — GUAIFENESIN ER 600 MG PO TB12: 600.0000 mg | ORAL_TABLET | Freq: Two times a day (BID) | ORAL | Status: DC

## 2015-01-28 MED ORDER — ALBUTEROL SULFATE HFA 108 (90 BASE) MCG/ACT IN AERS: 2.0000 | INHALATION_SPRAY | Freq: Four times a day (QID) | RESPIRATORY_TRACT | Status: DC | PRN

## 2015-01-28 MED ORDER — HYDROCODONE-HOMATROPINE 5-1.5 MG/5ML PO SYRP: 5.0000 mL | ORAL_SOLUTION | ORAL | Status: DC | PRN

## 2015-01-28 MED ORDER — ASPIRIN EC 81 MG PO TBEC: 81.0000 mg | DELAYED_RELEASE_TABLET | Freq: Every day | ORAL | Status: AC

## 2015-01-28 NOTE — Progress Notes (Signed)
OT Screen  Patient Details Name: Michael Herring MRN: [id number] DOB: [birth year]   OT Screen:     Pt awake, alert, and oriented this am. Pt with multiple family members in room. Pt reports symptoms have resolved, no complaints of pain with the exception of a headache. Pt demonstrates BUE range of motion WNL, strength 5/5 in BUE; coordination WNL and sensation intact. Pt is at baseline with ADL tasks, no further acute OT needs at this time.   Guadelupe Sabin, OTR/L  [phone]  01/28/2015, 9:01 AM
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
## 2015-01-28 NOTE — Accreditation Note (Signed)
Discharge instructions given w/ rx. Iv d/c'd. Pt discharged to home accompained by family.

## 2015-01-28 NOTE — Care Management Note (Signed)
Case Management Note  Patient Details  Name: Michael Herring MRN: [id number] Date of Birth: [birth year]  Expected Discharge Date:     01/28/2015             Expected Discharge Plan:  Home/Self Care  In-House Referral:  NA  Discharge planning Services  CM Consult  Post Acute Care Choice:  NA Choice offered to:  NA  DME Arranged:    DME Agency:     HH Arranged:    [place] Agency:     Status of Service:  Completed, signed off  Medicare Important Message Given:    Date Medicare IM Given:    Medicare IM give by:    Date Additional Medicare IM Given:    Additional Medicare Important Message give by:     If discussed at [place] of Stay Meetings, dates discussed:    Additional Comments: Pt is from home and ind at baseline. Pt admitted with TIA and has no residual deficits. Pt plans to discharge home with self care. No CM needs identified.  Sherald Barge, RN 01/28/2015, 1:01 PM

## 2015-01-28 NOTE — Progress Notes (Signed)
PT Cancellation Note  Patient Details Name: Michael Herring MRN: [id number] DOB: October 11, 1965   Cancelled Treatment:    Reason Eval/Treat Not Completed: PT screened, no needs identified, will sign off.  Pt reports that he is totally back to normal, has been up ambulating with no problem.   Demetrios Isaacs L   PT  01/28/2015, 10:54 AM [phone]

## 2015-01-29 LAB — HEMOGLOBIN A1C
Hgb A1c MFr Bld: 6.1 % — ABNORMAL HIGH (ref 4.8–5.6)
Mean Plasma Glucose: 128 mg/dL

## 2015-02-11 ENCOUNTER — Ambulatory Visit (INDEPENDENT_AMBULATORY_CARE_PROVIDER_SITE_OTHER): Payer: BLUE CROSS/BLUE SHIELD | Admitting: Family Medicine

## 2015-02-11 ENCOUNTER — Encounter: Payer: Self-pay | Admitting: Family Medicine

## 2015-02-11 VITALS — BP 134/92 | HR 91 | Temp 97.2°F | Ht 70.0 in | Wt 226.4 lb

## 2015-02-11 DIAGNOSIS — R0789 Other chest pain: Secondary | ICD-10-CM | POA: Diagnosis not present

## 2015-02-11 DIAGNOSIS — G459 Transient cerebral ischemic attack, unspecified: Secondary | ICD-10-CM | POA: Diagnosis not present

## 2015-02-11 DIAGNOSIS — F17200 Nicotine dependence, unspecified, uncomplicated: Secondary | ICD-10-CM

## 2015-02-11 DIAGNOSIS — R030 Elevated blood-pressure reading, without diagnosis of hypertension: Secondary | ICD-10-CM | POA: Diagnosis not present
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
## 2015-02-11 NOTE — Patient Instructions (Addendum)
Great to meet you!  Lets keep track of your blood pressure for 2-3 weeks and then see you back to decide about treatment.   Come back in 2-3 weeks  Stopping smoking would be the best thing you can do for your health.   You will hear about an appointment with cardiology within a week or so

## 2015-02-24 ENCOUNTER — Ambulatory Visit (INDEPENDENT_AMBULATORY_CARE_PROVIDER_SITE_OTHER): Payer: BLUE CROSS/BLUE SHIELD | Admitting: Cardiology

## 2015-02-24 ENCOUNTER — Ambulatory Visit: Payer: BLUE CROSS/BLUE SHIELD | Admitting: Family Medicine

## 2015-02-24 ENCOUNTER — Encounter: Payer: Self-pay | Admitting: Cardiology

## 2015-02-24 VITALS — BP 124/84 | HR 90 | Ht 70.0 in | Wt 227.0 lb

## 2015-02-24 DIAGNOSIS — R030 Elevated blood-pressure reading, without diagnosis of hypertension: Secondary | ICD-10-CM

## 2015-02-24 DIAGNOSIS — Z8673 Personal history of transient ischemic attack (TIA), and cerebral infarction without residual deficits: Secondary | ICD-10-CM | POA: Diagnosis not present

## 2015-02-24 DIAGNOSIS — R072 Precordial pain: Secondary | ICD-10-CM | POA: Diagnosis not present

## 2015-02-24 DIAGNOSIS — IMO0001 Reserved for inherently not codable concepts without codable children: Secondary | ICD-10-CM

## 2015-02-24 NOTE — Patient Instructions (Addendum)
Your physician recommends that you schedule a follow-up appointment in: to be determined after test    Your physician has requested that you have a stress echocardiogram. For further information please visit [email or web address]. Please follow instruction sheet as given.       Thank you for choosing [facility] !                             Thank you for choosing [facility] !
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
## 2015-02-27 ENCOUNTER — Ambulatory Visit (HOSPITAL_COMMUNITY)
Admission: RE | Admit: 2015-02-27 | Discharge: 2015-02-27 | Disposition: A | Discharge status: Home / Self Care | Payer: BLUE CROSS/BLUE SHIELD | Source: Ambulatory Visit | Attending: Cardiology | Admitting: Cardiology

## 2015-02-27 ENCOUNTER — Encounter (HOSPITAL_COMMUNITY): Payer: Self-pay

## 2015-02-27 DIAGNOSIS — R072 Precordial pain: Secondary | ICD-10-CM | POA: Insufficient documentation

## 2015-02-27 LAB — ECHOCARDIOGRAM STRESS TEST
CHL CUP MPHR: 172 {beats}/min
CHL RATE OF PERCEIVED EXERTION: 17
CSEPED: 9 min
CSEPHR: 87 %
CSEPPHR: 150 {beats}/min
Estimated workload: 11.8 METS
Exercise duration (sec): 42 s
Rest HR: 77 {beats}/min

## 2015-02-28 ENCOUNTER — Ambulatory Visit (INDEPENDENT_AMBULATORY_CARE_PROVIDER_SITE_OTHER): Payer: BLUE CROSS/BLUE SHIELD | Admitting: Family Medicine

## 2015-02-28 ENCOUNTER — Encounter: Payer: Self-pay | Admitting: Family Medicine

## 2015-02-28 VITALS — BP 128/87 | HR 79 | Temp 97.4°F | Ht 70.0 in | Wt 233.6 lb

## 2015-02-28 DIAGNOSIS — I10 Essential (primary) hypertension: Secondary | ICD-10-CM | POA: Diagnosis not present

## 2015-02-28 MED ORDER — LISINOPRIL 10 MG PO TABS: 10.0000 mg | ORAL_TABLET | Freq: Every day | ORAL | Status: DC

## 2015-02-28 NOTE — Progress Notes (Signed)
   HPI  Patient presents today for all up elevated blood pressure.  Patient was admitted to the hospital in September for a TIA. Follow-up his blood pressure was noted to be elevated, he states that at home it was better so we gave him a few weeks to keep a blood pressure log and to monitor that. Since he went home he has had 2 weeks of readings, the first week the average BP was 150/90 The second week average BP was 125/90 He denies headache, leg swelling, palpitations, or chest pain. He does have chest tightness which he feels is related to a recent cold he had. He had a stress echocardiogram yesterday which was low risk and had a hypertensive response. He has cut back significantly on his salt intake   PMH: Smoking status noted ROS: Per HPI  Objective: BP 128/87 mmHg  Pulse 79  Temp(Src) 97.4 F (36.3 C) (Oral)  Ht 5\' 10"  (1.778 m)  Wt 233 lb 9.6 oz (105.96 kg)  BMI 33.52 kg/m2 Gen: NAD, alert, cooperative with exam HEENT: NCAT CV: RRR, good S1/S2, no murmur Resp: CTABL, no wheezes, non-labored Ext: No edema, warm Neuro: Alert and oriented, No gross deficits  Assessment and plan:  # Hypertension Mild hypertension, After discussion with him we both agreed to treat I would make his goal 120/80 given his recent TIA Start low-dose lisinopril, repeat labs in one month, kidney function normal one month ago Keep log, follow-up one month    Meds ordered this encounter  Medications  . lisinopril (PRINIVIL,ZESTRIL) 10 MG tablet    Sig: Take 1 tablet (10 mg total) by mouth daily.    Dispense:  90 tablet    Refill:  [place], MD [facility] 02/28/2015, 8:47 AM

## 2015-02-28 NOTE — Patient Instructions (Addendum)
Great to see you!  You have done a great job cutting down on salt, keep it up!  Start lisinopril, come back in 1 month for hypertension and repeat blood work

## 2015-03-18 ENCOUNTER — Encounter: Payer: Self-pay | Admitting: Family Medicine

## 2015-03-18 ENCOUNTER — Ambulatory Visit (INDEPENDENT_AMBULATORY_CARE_PROVIDER_SITE_OTHER): Payer: BLUE CROSS/BLUE SHIELD | Admitting: Family Medicine

## 2015-03-18 VITALS — BP 145/91 | HR 84 | Temp 97.3°F | Ht 70.0 in | Wt 227.8 lb

## 2015-03-18 DIAGNOSIS — F17200 Nicotine dependence, unspecified, uncomplicated: Secondary | ICD-10-CM

## 2015-03-18 DIAGNOSIS — K4090 Unilateral inguinal hernia, without obstruction or gangrene, not specified as recurrent: Secondary | ICD-10-CM | POA: Diagnosis not present

## 2015-03-18 DIAGNOSIS — I10 Essential (primary) hypertension: Secondary | ICD-10-CM | POA: Diagnosis not present

## 2015-03-18 MED ORDER — AMLODIPINE BESYLATE 2.5 MG PO TABS: 2.5000 mg | ORAL_TABLET | Freq: Every day | ORAL | Status: DC

## 2015-03-18 NOTE — Patient Instructions (Signed)
Great to see you!  Stop lisinopril Start amlodipine  You will hear from [place] for an ultrasound You will also hear about a general surgery referral.   Hernia, Adult A hernia is the bulging of an organ or tissue through a weak spot in the muscles of the abdomen (abdominal wall). Hernias develop most often near the navel or groin. There are many kinds of hernias. Common kinds include:  Femoral hernia. This kind of hernia develops under the groin in the upper thigh area.  Inguinal hernia. This kind of hernia develops in the groin or scrotum.  Umbilical hernia. This kind of hernia develops near the navel.  Hiatal hernia. This kind of hernia causes part of the stomach to be pushed up into the chest.  Incisional hernia. This kind of hernia bulges through a scar from an abdominal surgery. CAUSES This condition may be caused by:  Heavy lifting.  Coughing over a long period of time.  Straining to have a bowel movement.  An incision made during an abdominal surgery.  A birth defect (congenital defect).  Excess weight or obesity.  Smoking.  Poor nutrition.  Cystic fibrosis.  Excess fluid in the abdomen.  Undescended testicles. SYMPTOMS Symptoms of a hernia include:  A lump on the abdomen. This is the first sign of a hernia. The lump may become more obvious with standing, straining, or coughing. It may get bigger over time if it is not treated or if the condition causing it is not treated.  Pain. A hernia is usually painless, but it may become painful over time if treatment is delayed. The pain is usually dull and may get worse with standing or lifting heavy objects. Sometimes a hernia gets tightly squeezed in the weak spot (strangulated) or stuck there (incarcerated) and causes additional symptoms. These symptoms may include:  Vomiting.  Nausea.  Constipation.  Irritability. DIAGNOSIS A hernia may be diagnosed with:  A physical exam. During the exam your health care  provider may ask you to cough or to make a specific movement, because a hernia is usually more visible when you move.  Imaging tests. These can include:  X-rays.  Ultrasound.  CT scan. TREATMENT A hernia that is small and painless may not need to be treated. A hernia that is large or painful may be treated with surgery. Inguinal hernias may be treated with surgery to prevent incarceration or strangulation. Strangulated hernias are always treated with surgery, because lack of blood to the trapped organ or tissue can cause it to die. Surgery to treat a hernia involves pushing the bulge back into place and repairing the weak part of the abdomen. HOME CARE INSTRUCTIONS  Avoid straining.  Do not lift anything heavier than 10 lb (4.5 kg).  Lift with your leg muscles, not your back muscles. This helps avoid strain.  When coughing, try to cough gently.  Prevent constipation. Constipation leads to straining with bowel movements, which can make a hernia worse or cause a hernia repair to break down. You can prevent constipation by:  Eating a high-fiber diet that includes plenty of fruits and vegetables.  Drinking enough fluids to keep your urine clear or pale yellow. Aim to drink 6-8 glasses of water per day.  Using a stool softener as directed by your health care provider.  Lose weight, if you are overweight.  Do not use any tobacco products, including cigarettes, chewing tobacco, or electronic cigarettes. If you need help quitting, ask your health care provider.  Keep all  follow-up visits as directed by your health care provider. This is important. Your health care provider may need to monitor your condition. SEEK MEDICAL CARE IF:  You have swelling, redness, and pain in the affected area.  Your bowel habits change. SEEK IMMEDIATE MEDICAL CARE IF:  You have a fever.  You have abdominal pain that is getting worse.  You feel nauseous or you vomit.  You cannot push the hernia back  in place by gently pressing on it while you are lying down.  The hernia:  Changes in shape or size.  Is stuck outside the abdomen.  Becomes discolored.  Feels hard or tender.   This information is not intended to replace advice given to you by your health care provider. Make sure you discuss any questions you have with your health care provider.   Document Released: 04/19/2005 Document Revised: 05/10/2014 Document Reviewed: 02/27/2014 Elsevier Interactive Patient Education [employer].

## 2015-03-18 NOTE — Progress Notes (Signed)
   HPI  Patient presents today here to discuss high blood pressure and hernia.  Hypertension Started lisinopril about 2 weeks ago, about 3 days ago he developed dizziness and increasing blood pressure. He states that his blood pressure was high as 160s over 118, prior to starting the medication he states that his blood pressure was 120s over 80s. He denies headache, chest pain, palpitations, leg edema.  Concern for hernia 2 weeks of right groin pain with cough or straining. He's never had a problem with his groin before. He is concerned he has a hernia because the pain improves with him supporting the area that hurts. He is a smoker and shiny Back.  PMH: Smoking status noted ROS: Per HPI  Objective: BP 145/91 mmHg  Pulse 84  Temp(Src) 97.3 F (36.3 C) (Oral)  Ht 5\' 10"  (1.778 m)  Wt 227 lb 12.8 oz (103.329 kg)  BMI 32.69 kg/m2 Gen: NAD, alert, cooperative with exam HEENT: NCAT CV: RRR, good S1/S2, no murmur Resp: CTABL, no wheezes, non-labored Ext: No edema, warm Neuro: Alert and oriented, No gross deficits GU: buldge in R inguinal canal consitent with hernia  Assessment and plan:  #  hypertension Stop lisinopril, with paradoxical increase in blood pressure while taking ACE inhibitor will go ahead and check for renal artery stenosis. Start amlodipine 2.5 mg Goal of 120/80 considering previous TIA  #  hernia No signs of incarceration, refer to general surgery for consultation  Tobacco abuse Trying to quit  Orders Placed This Encounter  Procedures  . US Renal Artery Stenosis    Standing Status: Future     Number of Occurrences:      Standing Expiration Date: 05/17/2016    Order Specific Question:  Reason for Exam (SYMPTOM  OR DIAGNOSIS REQUIRED)    Answer:  ruleout renal artery stenosis, increased BP with Ace    Order Specific Question:  Preferred imaging location?    Answer:  [facility]  . Ambulatory referral to General Surgery    Referral Priority:   Routine    Referral Type:  Surgical    Referral Reason:  Specialty Services Required    Requested Specialty:  General Surgery    Number of Visits Requested:  1    Meds ordered this encounter  Medications  . amLODipine (NORVASC) 2.5 MG tablet    Sig: Take 1 tablet (2.5 mg total) by mouth daily.    Dispense:  90 tablet    Refill:  [place], MD [facility] 03/18/2015, 1:32 PM

## 2015-04-01 ENCOUNTER — Ambulatory Visit: Payer: BLUE CROSS/BLUE SHIELD | Admitting: Family Medicine

## 2015-04-07 ENCOUNTER — Other Ambulatory Visit: Payer: Self-pay | Admitting: Surgery

## 2015-04-15 ENCOUNTER — Encounter: Payer: Self-pay | Admitting: Family Medicine

## 2015-04-15 ENCOUNTER — Ambulatory Visit (INDEPENDENT_AMBULATORY_CARE_PROVIDER_SITE_OTHER): Payer: BLUE CROSS/BLUE SHIELD | Admitting: Family Medicine

## 2015-04-15 ENCOUNTER — Other Ambulatory Visit: Payer: Self-pay

## 2015-04-15 VITALS — BP 131/88 | HR 96 | Temp 97.5°F | Ht 70.0 in | Wt 229.2 lb

## 2015-04-15 DIAGNOSIS — I10 Essential (primary) hypertension: Secondary | ICD-10-CM

## 2015-04-15 DIAGNOSIS — I701 Atherosclerosis of renal artery: Secondary | ICD-10-CM

## 2015-04-15 DIAGNOSIS — K4090 Unilateral inguinal hernia, without obstruction or gangrene, not specified as recurrent: Secondary | ICD-10-CM

## 2015-04-15 NOTE — Patient Instructions (Addendum)
Great to see you!  We will work on the ultrasound   Be sure to keep in contact with the surgeons  Lets follow up in 3 months  Consider stopping smoking.  1800 Quit now

## 2015-04-15 NOTE — Progress Notes (Signed)
   HPI  Patient presents today to discuss hypertension and his hernia.  Hypertension Tolerating medications well Recent increase in blood pressure with lisinopril, we stopped this and started amlodipine Blood pressure at home averages 130/85. No chest pain, palpitations or leg edema, dyspnea.  Inguinal hernia He has seen general surgery since our last visit, he has surgeries is currently being planned. He had recent worsening of right groin pain after physical exertion He's been started on hydrocodone by his surgeons. He has good precautions for seeking emergency medical care  PMH: Smoking status noted ROS: Per HPI  Objective: BP 131/88 mmHg  Pulse 96  Temp(Src) 97.5 F (36.4 C) (Oral)  Ht 5\' 10"  (1.778 m)  Wt 229 lb 3.2 oz (103.964 kg)  BMI 32.89 kg/m2 Gen: NAD, alert, cooperative with exam HEENT: NCAT CV: RRR, good S1/S2, no murmur Resp: CTABL, no wheezes, non-labored Ext: No edema, warm Neuro: Alert and oriented, No gross deficits   Right inguinal hernia, with bulge with cough, there is no signs of incarceration  Assessment and plan:  # Hypertension Well-controlled Continue amlodipine Still waiting on ultrasound to be scheduled. - I'm performing this with a paradoxical increase in blood pressure after starting an ACE inhibitor to rule out renal artery stenosis  # inguinal hernia Pending surgery, and encouraged him to keep in contact with his surgeons as needed And we have reviewed reasons to seek emergency medical care     Laroy Apple, MD [facility] 04/15/2015, 8:57 AM

## 2015-04-17 ENCOUNTER — Encounter (HOSPITAL_COMMUNITY): Payer: Self-pay

## 2015-04-17 ENCOUNTER — Encounter (HOSPITAL_COMMUNITY)
Admission: RE | Admit: 2015-04-17 | Discharge: 2015-04-17 | Disposition: A | Discharge status: Home / Self Care | Payer: BLUE CROSS/BLUE SHIELD | Source: Ambulatory Visit | Attending: Surgery | Admitting: Surgery

## 2015-04-17 DIAGNOSIS — Z79899 Other long term (current) drug therapy: Secondary | ICD-10-CM | POA: Diagnosis not present

## 2015-04-17 DIAGNOSIS — Z01818 Encounter for other preprocedural examination: Secondary | ICD-10-CM | POA: Insufficient documentation

## 2015-04-17 DIAGNOSIS — Z7982 Long term (current) use of aspirin: Secondary | ICD-10-CM | POA: Insufficient documentation

## 2015-04-17 DIAGNOSIS — Z8673 Personal history of transient ischemic attack (TIA), and cerebral infarction without residual deficits: Secondary | ICD-10-CM | POA: Diagnosis not present

## 2015-04-17 DIAGNOSIS — Z01812 Encounter for preprocedural laboratory examination: Secondary | ICD-10-CM | POA: Insufficient documentation

## 2015-04-17 DIAGNOSIS — F17200 Nicotine dependence, unspecified, uncomplicated: Secondary | ICD-10-CM | POA: Insufficient documentation

## 2015-04-17 DIAGNOSIS — K4020 Bilateral inguinal hernia, without obstruction or gangrene, not specified as recurrent: Secondary | ICD-10-CM | POA: Insufficient documentation

## 2015-04-17 HISTORY — DX: Alcohol dependence, in remission: F10.21

## 2015-04-17 HISTORY — DX: Unspecified convulsions: R56.9

## 2015-04-17 HISTORY — DX: Accidental discharge from unspecified firearms or gun, initial encounter: W34.00XA

## 2015-04-17 HISTORY — DX: Cardiac arrhythmia, unspecified: I49.9

## 2015-04-17 HISTORY — DX: Family history of other specified conditions: Z84.89

## 2015-04-17 LAB — BASIC METABOLIC PANEL
Anion gap: 9 (ref 5–15)
BUN: 8 mg/dL (ref 6–20)
CALCIUM: 9.1 mg/dL (ref 8.9–10.3)
CO2: 25 mmol/L (ref 22–32)
CREATININE: 1 mg/dL (ref 0.61–1.24)
Chloride: 105 mmol/L (ref 101–111)
GFR calc Af Amer: >60 mL/min (ref >60)
Glucose, Bld: 89 mg/dL (ref 65–99)
POTASSIUM: 4.3 mmol/L (ref 3.5–5.1)
SODIUM: 139 mmol/L (ref 135–145)

## 2015-04-17 LAB — CBC
HCT: 48.9 % (ref 39.0–52.0)
Hemoglobin: 16.9 g/dL (ref 13.0–17.0)
MCH: 30.8 pg (ref 26.0–34.0)
MCHC: 34.6 g/dL (ref 30.0–36.0)
MCV: 89.1 fL (ref 78.0–100.0)
PLATELETS: 204 10*3/uL (ref 150–400)
RBC: 5.49 MIL/uL (ref 4.22–5.81)
RDW: 13 % (ref 11.5–15.5)
WBC: 7.9 10*3/uL (ref 4.0–10.5)
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
## 2015-04-17 NOTE — Pre-Procedure Instructions (Signed)
Michael Herring  04/17/2015      [id number] - [place], [place] [address] Phone: [phone] Fax: [phone]    Your procedure is scheduled on Wednesday, Dec. 21st   Report to [place] at 9:15 AM   Call this number if you have problems the morning of surgery:  [phone]   Remember:  Do not eat food or drink liquids after midnight Tuesday .  Take these medicines the morning of surgery with A SIP OF WATER : Norvasc, Pain medication. Please use your inhaler the morning of surgery.   Do not wear jewelry - no rings or watches.  Do not wear lotions or colognes.  You may NOT wear deodorant the day of surgery.             Men may shave face and neck.   Do not bring valuables to the hospital.  [facility] is not responsible for any belongings or valuables.  Contacts, dentures or bridgework may not be worn into surgery.  Leave your suitcase in the car.  After surgery it may be brought to your room. For patients admitted to the hospital, discharge time will be determined by your treatment team.  Patients discharged the day of surgery will not be allowed to drive home.   Name and phone number of your driver:     Please read over the following fact sheets that you were given. Pain Booklet, Coughing and Deep Breathing and Surgical Site Infection Prevention

## 2015-04-17 NOTE — Progress Notes (Signed)
   04/17/15 1306  OBSTRUCTIVE SLEEP APNEA  Have you ever been diagnosed with sleep apnea through a sleep study? No  Do you snore loudly (loud enough to be heard through closed doors)?  1  Do you often feel tired, fatigued, or sleepy during the daytime (such as falling asleep during driving or talking to someone)? 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 0  Do you have, or are you being treated for high blood pressure? 1  BMI more than 35 kg/m2? 0  Age > 50 (1-yes) 0  Neck circumference greater than:Male 16 inches or larger, Male 17inches or larger? 1  Male Gender (Yes=1) 1  Obstructive Sleep Apnea Score 5  Score 5 or greater  Results sent to PCP

## 2015-04-18 ENCOUNTER — Encounter (HOSPITAL_COMMUNITY): Payer: Self-pay

## 2015-04-18 NOTE — Progress Notes (Signed)
Anesthesia Chart Review:  Pt is 49 year old male scheduled for B laparoscopic inguinal hernia repair on 04/23/2015 with Dr. Evlyn Courier.   PMH includes:  TIA (01/2015), intermittently elevated BP, dysrhythmia, hx alcohol abuse, seizures. Current smoker. BMI 33.   Hospitalized 9/26-9/27/16 for suspected TIA. Transient LUE weakness resolved on admission. MRI/MRA and CT head negative.   Saw Dr. Rozann Lesches with cardiology 02/24/15 for chest tightness. Low risk stress echo as below.   Medications include: albuterol, amlodipine, ASA.   Preoperative labs reviewed.    EKG 01/27/15: Sinus rhythm. Probable left atrial enlargement  Stress echo 02/27/15:  - Stress ECG conclusions: The stress ECG was negative for ischemia. Duke scoring: exercise time of 9.5 min; maximum ST deviation of 11mm; no angina; resulting score is 10. This score predicts a low risk of cardiac events. There was a hypertensive response to stress. - Staged echo: There was no echocardiographic evidence for stress-induced ischemia.  Echo 01/28/15:  - Left ventricle: The cavity size was normal. Wall thickness was increased increased in a pattern of mild to moderate LVH. Systolic function was normal. The estimated ejection fraction was in the range of 55% to 60%. Wall motion was normal; there were no regional wall motion abnormalities. - Aortic valve: Valve area (VTI): 3.04 cm^2. Valve area (Vmax): 3.12 cm^2. - Atrial septum: No defect or patent foramen ovale was identified. - Technically adequate study.  Carotid duplex 01/28/15: No significant carotid plaque or stenosis.  If no changes, I anticipate pt can proceed with surgery as scheduled.   Willeen Cass, FNP-BC [facility]/Anesthesiology Phone: [phone] 04/18/2015 2:46 PM

## 2015-04-22 MED ORDER — CEFAZOLIN SODIUM-DEXTROSE 2-3 GM-% IV SOLR
2.0000 g | INTRAVENOUS | Status: AC
  Administered 2015-04-23: 2 g via INTRAVENOUS
  Filled 2015-04-22: qty 50

## 2015-04-22 NOTE — H&P (Signed)
Michael Herring. Michael Herring 04/07/2015 2:22 PM Location: [facility] Patient #: [id number] DOB: 12/29/1965 Single / Language: Michael Herring / Race: White Male   History of Present Illness (Michael Schiffman A. Ninfa Linden MD; 04/07/2015 2:34 PM) Patient words: New-.  The patient is a 49 year old male who presents for an evaluation of a hernia. This is a pleasant gentleman referred by Dr. Kenn Herring for evaluation of a symptomatic right inguinal hernia. The patient has had pain and a bulge in his right inguinal area for several weeks. He noticed it during a coughing spell from a cold. He is able to reduce the hernia. Most of his pain is when he is driving. The pain is moderate in intensity. It is described as sharp. He has no nausea, vomiting, or obstructive symptoms. He is otherwise without complaints.   Other Problems Michael Herring, CMA; 04/07/2015 2:28 PM) Cerebrovascular Accident Chest pain Migraine Headache  Past Surgical History Michael Herring, CMA; 04/07/2015 2:28 PM) Lung Surgery Left. Vasectomy  Diagnostic Studies History Michael Herring, CMA; 04/07/2015 2:28 PM) Colonoscopy never  Allergies Michael Herring, CMA; 04/07/2015 2:23 PM) No Known Drug Allergies12/09/2014  Medication History Michael Herring, CMA; 04/07/2015 2:24 PM) AmLODIPine Besylate (2.5MG  Tablet, Oral) Active. Aspirin (81MG  Tablet DR, Oral) Active. Lisinopril (10MG  Tablet, Oral) Active. Benzonatate (200MG  Capsule, Oral) Active. Ventolin HFA (108 (90 Base)MCG/ACT Aerosol Soln, Inhalation) Active. Medications Reconciled  Social History Michael Herring, CMA; 04/07/2015 2:28 PM) Alcohol use Recently quit alcohol use. Caffeine use Carbonated beverages, Tea. Illicit drug use Remotely quit drug use. Tobacco use Current every day smoker.  Family History Michael Herring, CMA; 04/07/2015 2:28 PM) Hypertension Michael Herring, Michael Herring.    Review of Systems Michael Herring CMA; 04/07/2015 2:28 PM) General Not Present-  Appetite Loss, Chills, Fatigue, Fever, Night Sweats, Weight Gain and Weight Loss. Skin Not Present- Change in Wart/Mole, Dryness, Hives, Jaundice, New Lesions, Non-Healing Wounds, Rash and Ulcer. HEENT Present- Hearing Loss and Ringing in the Ears. Not Present- Earache, Hoarseness, Nose Bleed, Oral Ulcers, Seasonal Allergies, Sinus Pain, Sore Throat, Visual Disturbances, Wears glasses/contact lenses and Yellow Eyes. Respiratory Present- Snoring. Not Present- Bloody sputum, Chronic Cough, Difficulty Breathing and Wheezing. Breast Not Present- Breast Mass, Breast Pain, Nipple Discharge and Skin Changes. Cardiovascular Not Present- Chest Pain, Difficulty Breathing Lying Down, Leg Cramps, Palpitations, Rapid Heart Rate, Shortness of Breath and Swelling of Extremities. Gastrointestinal Present- Abdominal Pain. Not Present- Bloating, Bloody Stool, Change in Bowel Habits, Chronic diarrhea, Constipation, Difficulty Swallowing, Excessive gas, Gets full quickly at meals, Hemorrhoids, Indigestion, Nausea, Rectal Pain and Vomiting. Male Genitourinary Not Present- Blood in Urine, Change in Urinary Stream, Frequency, Impotence, Nocturia, Painful Urination, Urgency and Urine Leakage. Musculoskeletal Not Present- Back Pain, Joint Pain, Joint Stiffness, Muscle Pain, Muscle Weakness and Swelling of Extremities. Neurological Not Present- Decreased Memory, Fainting, Headaches, Numbness, Seizures, Tingling, Tremor, Trouble walking and Weakness. Psychiatric Not Present- Anxiety, Bipolar, Change in Sleep Pattern, Depression, Fearful and Frequent crying. Endocrine Not Present- Cold Intolerance, Excessive Hunger, Hair Changes, Heat Intolerance, Hot flashes and New Diabetes. Hematology Not Present- Easy Bruising, Excessive bleeding, Gland problems, HIV and Persistent Infections.  Vitals (Michael Herring CMA; 04/07/2015 2:23 PM) 04/07/2015 2:23 PM Weight: 229 lb Height: 70in Body Surface Area: 2.21 m Body Mass Index: 32.86  kg/m  Temp.: 98.16F(Oral)  Pulse: 82 (Regular)  BP: 144/92 (Sitting, Left Arm, Standard)       Physical Exam (Michael Wentworth A. Ninfa Linden MD; 04/07/2015 2:35 PM) General Mental Status-Alert. General Appearance-Consistent with stated age. Hydration-Well hydrated. Voice-Normal.  Head and  Neck Head-normocephalic, atraumatic with no lesions or palpable masses. Trachea-midline.  Eye Eyeball - Bilateral-Extraocular movements intact. Sclera/Conjunctiva - Bilateral-No scleral icterus.  Chest and Lung Exam Chest and lung exam reveals -quiet, even and easy respiratory effort with no use of accessory muscles and on auscultation, normal breath sounds, no adventitious sounds and normal vocal resonance. Inspection Chest Wall - Normal. Back - normal.  Cardiovascular Cardiovascular examination reveals -normal heart sounds, regular rate and rhythm with no murmurs and normal pedal pulses bilaterally.  Abdomen Inspection Skin - Scar - no surgical scars. Hernias - Umbilical hernia - Reducible. Note: There is a small fascial defect at the umbilicus. Inguinal hernia - Left - Reducible. Right - Reducible. Note: He has a moderate-sized, reducible but mildly tender right inguinal hernia and a tiny, reducible left inguinal hernia. Palpation/Percussion Palpation and Percussion of the abdomen reveal - Soft, Non Tender, No Rebound tenderness, No Rigidity (guarding) and No hepatosplenomegaly. Auscultation Auscultation of the abdomen reveals - Bowel sounds normal.  Neurologic Neurologic evaluation reveals -alert and oriented x 3 with no impairment of recent or remote memory. Mental Status-Normal.  Musculoskeletal Normal Exam - Left-Upper Extremity Strength Normal and Lower Extremity Strength Normal. Normal Exam - Right-Upper Extremity Strength Normal, Lower Extremity Weakness.    Assessment & Plan (Taneisha Fuson A. Ninfa Linden MD; 04/07/2015 2:36 PM) BILATERAL INGUINAL HERNIA  (K40.20) Impression: I discussed the diagnosis with him in detail. Laparoscopic bilateral inguinal hernia repair with mesh as well as open umbilical hernia repair is recommended. I discussed the procedure with him in detail. I gave him literature regarding the surgery. I discussed the risks which includes but is not limited to bleeding, infection, injury to surrounding structures, nerve entrapment, chronic pain, recurrent hernia, need for further surgery, postoperative recovery, DVT, etc. He understands and wishes to proceed with surgery will be scheduled. Current Plans Pt Education - Pamphlet Given - Laparoscopic Hernia Repair: discussed with patient and provided information. UMBILICAL HERNIA ([id number])

## 2015-04-23 ENCOUNTER — Ambulatory Visit (HOSPITAL_COMMUNITY)
Admission: RE | Admit: 2015-04-23 | Discharge: 2015-04-23 | Disposition: A | Discharge status: Home / Self Care | Payer: BLUE CROSS/BLUE SHIELD | Source: Ambulatory Visit | Attending: Surgery | Admitting: Surgery

## 2015-04-23 ENCOUNTER — Encounter (HOSPITAL_COMMUNITY): Payer: Self-pay | Admitting: Anesthesiology

## 2015-04-23 ENCOUNTER — Ambulatory Visit (HOSPITAL_COMMUNITY): Payer: BLUE CROSS/BLUE SHIELD | Admitting: Anesthesiology

## 2015-04-23 ENCOUNTER — Ambulatory Visit (HOSPITAL_COMMUNITY): Payer: BLUE CROSS/BLUE SHIELD | Admitting: Emergency Medicine

## 2015-04-23 ENCOUNTER — Encounter (HOSPITAL_COMMUNITY)
Admission: RE | Disposition: A | Discharge status: Home / Self Care | Payer: Self-pay | Source: Ambulatory Visit | Attending: Surgery

## 2015-04-23 DIAGNOSIS — Z7982 Long term (current) use of aspirin: Secondary | ICD-10-CM | POA: Insufficient documentation

## 2015-04-23 DIAGNOSIS — Z79899 Other long term (current) drug therapy: Secondary | ICD-10-CM | POA: Insufficient documentation

## 2015-04-23 DIAGNOSIS — K4020 Bilateral inguinal hernia, without obstruction or gangrene, not specified as recurrent: Secondary | ICD-10-CM | POA: Diagnosis present

## 2015-04-23 DIAGNOSIS — I10 Essential (primary) hypertension: Secondary | ICD-10-CM | POA: Insufficient documentation

## 2015-04-23 DIAGNOSIS — K429 Umbilical hernia without obstruction or gangrene: Secondary | ICD-10-CM | POA: Insufficient documentation

## 2015-04-23 DIAGNOSIS — Z8673 Personal history of transient ischemic attack (TIA), and cerebral infarction without residual deficits: Secondary | ICD-10-CM | POA: Insufficient documentation

## 2015-04-23 DIAGNOSIS — F17200 Nicotine dependence, unspecified, uncomplicated: Secondary | ICD-10-CM | POA: Diagnosis not present

## 2015-04-23 HISTORY — PX: INGUINAL HERNIA REPAIR: SHX194

## 2015-04-23 HISTORY — PX: INSERTION OF MESH: SHX5868

## 2015-04-23 HISTORY — PX: UMBILICAL HERNIA REPAIR: SHX196

## 2015-04-23 SURGERY — REPAIR, HERNIA, INGUINAL, BILATERAL, LAPAROSCOPIC
Anesthesia: General | Site: Groin

## 2015-04-23 MED ORDER — ONDANSETRON HCL 4 MG/2ML IJ SOLN
INTRAMUSCULAR | Status: DC | PRN
  Administered 2015-04-23: 4 mg via INTRAVENOUS

## 2015-04-23 MED ORDER — ONDANSETRON HCL 4 MG/2ML IJ SOLN
INTRAMUSCULAR | Status: AC
  Filled 2015-04-23: qty 2

## 2015-04-23 MED ORDER — PROPOFOL 10 MG/ML IV BOLUS
INTRAVENOUS | Status: AC
  Filled 2015-04-23: qty 20

## 2015-04-23 MED ORDER — ROCURONIUM BROMIDE 100 MG/10ML IV SOLN
INTRAVENOUS | Status: DC | PRN
  Administered 2015-04-23 (×2): 10 mg via INTRAVENOUS
  Administered 2015-04-23: 40 mg via INTRAVENOUS

## 2015-04-23 MED ORDER — DEXAMETHASONE SODIUM PHOSPHATE 4 MG/ML IJ SOLN
INTRAMUSCULAR | Status: AC
  Filled 2015-04-23: qty 2

## 2015-04-23 MED ORDER — BUPIVACAINE HCL (PF) 0.25 % IJ SOLN
INTRAMUSCULAR | Status: DC | PRN
  Administered 2015-04-23: 20 mL

## 2015-04-23 MED ORDER — ROCURONIUM BROMIDE 50 MG/5ML IV SOLN
INTRAVENOUS | Status: AC
  Filled 2015-04-23: qty 1

## 2015-04-23 MED ORDER — DEXAMETHASONE SODIUM PHOSPHATE 4 MG/ML IJ SOLN
INTRAMUSCULAR | Status: DC | PRN
  Administered 2015-04-23: 8 mg via INTRAVENOUS

## 2015-04-23 MED ORDER — PROPOFOL 10 MG/ML IV BOLUS
INTRAVENOUS | Status: DC | PRN
  Administered 2015-04-23: 200 mg via INTRAVENOUS

## 2015-04-23 MED ORDER — OXYCODONE HCL 5 MG PO TABS
5.0000 mg | ORAL_TABLET | ORAL | Status: DC | PRN
  Administered 2015-04-23: 10 mg via ORAL

## 2015-04-23 MED ORDER — SUGAMMADEX SODIUM 500 MG/5ML IV SOLN
INTRAVENOUS | Status: DC | PRN
  Administered 2015-04-23: 500 mg via INTRAVENOUS

## 2015-04-23 MED ORDER — LIDOCAINE HCL (CARDIAC) 20 MG/ML IV SOLN
INTRAVENOUS | Status: DC | PRN
  Administered 2015-04-23: 100 mg via INTRAVENOUS

## 2015-04-23 MED ORDER — SODIUM CHLORIDE 0.9 % IR SOLN
Status: DC | PRN
  Administered 2015-04-23: 1000 mL

## 2015-04-23 MED ORDER — LACTATED RINGERS IV SOLN
INTRAVENOUS | Status: DC | PRN
  Administered 2015-04-23: 10:00:00 via INTRAVENOUS

## 2015-04-23 MED ORDER — FENTANYL CITRATE (PF) 250 MCG/5ML IJ SOLN
INTRAMUSCULAR | Status: AC
  Filled 2015-04-23: qty 5

## 2015-04-23 MED ORDER — MEPERIDINE HCL 25 MG/ML IJ SOLN: 6.2500 mg | INTRAMUSCULAR | Status: DC | PRN

## 2015-04-23 MED ORDER — LIDOCAINE HCL (CARDIAC) 20 MG/ML IV SOLN
INTRAVENOUS | Status: AC
  Filled 2015-04-23: qty 5

## 2015-04-23 MED ORDER — PROMETHAZINE HCL 25 MG/ML IJ SOLN: 6.2500 mg | INTRAMUSCULAR | Status: DC | PRN

## 2015-04-23 MED ORDER — MIDAZOLAM HCL 5 MG/5ML IJ SOLN
INTRAMUSCULAR | Status: DC | PRN
  Administered 2015-04-23: 2 mg via INTRAVENOUS

## 2015-04-23 MED ORDER — OXYCODONE HCL 5 MG PO TABS
ORAL_TABLET | ORAL | Status: DC
Start: 2015-04-23 — End: 2015-04-23
  Filled 2015-04-23: qty 2

## 2015-04-23 MED ORDER — HYDROCODONE-ACETAMINOPHEN 5-325 MG PO TABS: 1.0000 | ORAL_TABLET | ORAL | Status: DC | PRN

## 2015-04-23 MED ORDER — MIDAZOLAM HCL 2 MG/2ML IJ SOLN
INTRAMUSCULAR | Status: AC
  Filled 2015-04-23: qty 2

## 2015-04-23 MED ORDER — HYDROMORPHONE HCL 1 MG/ML IJ SOLN
0.2500 mg | INTRAMUSCULAR | Status: DC | PRN
  Administered 2015-04-23 (×2): 0.5 mg via INTRAVENOUS

## 2015-04-23 MED ORDER — HYDROMORPHONE HCL 1 MG/ML IJ SOLN
INTRAMUSCULAR | Status: AC
  Administered 2015-04-23: 0.5 mg via INTRAVENOUS
  Filled 2015-04-23: qty 1

## 2015-04-23 MED ORDER — FENTANYL CITRATE (PF) 100 MCG/2ML IJ SOLN
INTRAMUSCULAR | Status: DC | PRN
  Administered 2015-04-23 (×3): 50 ug via INTRAVENOUS
  Administered 2015-04-23: 100 ug via INTRAVENOUS

## 2015-04-23 MED ORDER — LACTATED RINGERS IV SOLN
INTRAVENOUS | Status: DC
  Administered 2015-04-23: 10:00:00 via INTRAVENOUS

## 2015-04-23 SURGICAL SUPPLY — 42 items
APPLIER CLIP LOGIC TI 5 (MISCELLANEOUS) IMPLANT
APR CLP MED LRG 33X5 (MISCELLANEOUS)
BLADE SURG ROTATE 9660 (MISCELLANEOUS) ×2 IMPLANT
CANISTER SUCTION 2500CC (MISCELLANEOUS) IMPLANT
CATH FOLEY 2WAY SLVR  5CC 14FR (CATHETERS) ×2
CATH FOLEY 2WAY SLVR 5CC 14FR (CATHETERS) IMPLANT
CHLORAPREP W/TINT 26ML (MISCELLANEOUS) ×5 IMPLANT
COVER SURGICAL LIGHT HANDLE (MISCELLANEOUS) ×5 IMPLANT
DEVICE SECURE STRAP 25 ABSORB (INSTRUMENTS) ×5 IMPLANT
DISSECT BALLN SPACEMKR + OVL (BALLOONS) ×5
DISSECTOR BALLN SPACEMKR + OVL (BALLOONS) ×3 IMPLANT
DISSECTOR BLUNT TIP ENDO 5MM (MISCELLANEOUS) IMPLANT
DRAPE LAPAROSCOPIC ABDOMINAL (DRAPES) ×5 IMPLANT
ELECT REM PT RETURN 9FT ADLT (ELECTROSURGICAL) ×5
ELECTRODE REM PT RTRN 9FT ADLT (ELECTROSURGICAL) ×3 IMPLANT
GLOVE BIO SURGEON STRL SZ8 (GLOVE) ×2 IMPLANT
GLOVE BIOGEL PI IND STRL 7.0 (GLOVE) IMPLANT
GLOVE BIOGEL PI IND STRL 8.5 (GLOVE) IMPLANT
GLOVE BIOGEL PI INDICATOR 7.0 (GLOVE) ×2
GLOVE BIOGEL PI INDICATOR 8.5 (GLOVE) ×2
GLOVE SURG SIGNA 7.5 PF LTX (GLOVE) ×5 IMPLANT
GLOVE SURG SS PI 6.5 STRL IVOR (GLOVE) ×2 IMPLANT
GOWN STRL REUS W/ TWL LRG LVL3 (GOWN DISPOSABLE) ×6 IMPLANT
GOWN STRL REUS W/ TWL XL LVL3 (GOWN DISPOSABLE) ×3 IMPLANT
GOWN STRL REUS W/TWL LRG LVL3 (GOWN DISPOSABLE) ×5
GOWN STRL REUS W/TWL XL LVL3 (GOWN DISPOSABLE) ×10
KIT BASIN OR (CUSTOM PROCEDURE TRAY) ×5 IMPLANT
KIT ROOM TURNOVER OR (KITS) ×5 IMPLANT
LIQUID BAND (GAUZE/BANDAGES/DRESSINGS) ×5 IMPLANT
MESH 3DMAX 4X6 LT LRG (Mesh General) ×5 IMPLANT
MESH 3DMAX 4X6 RT LRG (Mesh General) ×5 IMPLANT
NDL INSUFFLATION 14GA 120MM (NEEDLE) IMPLANT
NEEDLE INSUFFLATION 14GA 120MM (NEEDLE) IMPLANT
NS IRRIG 1000ML POUR BTL (IV SOLUTION) ×5 IMPLANT
PAD ARMBOARD 7.5X6 YLW CONV (MISCELLANEOUS) ×5 IMPLANT
SET IRRIG TUBING LAPAROSCOPIC (IRRIGATION / IRRIGATOR) IMPLANT
SET TROCAR LAP APPLE-HUNT 5MM (ENDOMECHANICALS) ×5 IMPLANT
SUT MNCRL AB 4-0 PS2 18 (SUTURE) ×5 IMPLANT
TOWEL OR 17X26 10 PK STRL BLUE (TOWEL DISPOSABLE) ×5 IMPLANT
TRAY FOLEY CATH 16FR SILVER (SET/KITS/TRAYS/PACK) ×3 IMPLANT
TRAY LAPAROSCOPIC MC (CUSTOM PROCEDURE TRAY) ×5 IMPLANT
TUBING INSUFFLATION (TUBING) ×5 IMPLANT

## 2015-04-23 NOTE — Discharge Instructions (Signed)
[facility] _______[facility]  UMBILICAL OR INGUINAL HERNIA REPAIR: POST OP INSTRUCTIONS  Always review your discharge instruction sheet given to you by the facility where your surgery was performed. IF YOU HAVE DISABILITY OR FAMILY LEAVE FORMS, YOU MUST BRING THEM TO THE OFFICE FOR PROCESSING.   DO NOT GIVE THEM TO YOUR DOCTOR.  1. A  prescription for pain medication may be given to you upon discharge.  Take your pain medication as prescribed, if needed.  If narcotic pain medicine is not needed, then you may take acetaminophen (Tylenol) or ibuprofen (Advil) as needed. 2. Take your usually prescribed medications unless otherwise directed. 3. If you need a refill on your pain medication, please contact your pharmacy.  They will contact our office to request authorization. Prescriptions will not be filled after 5 pm or on week-ends. 4. You should follow a light diet the first 24 hours after arrival home, such as soup and crackers, etc.  Be sure to include lots of fluids daily.  Resume your normal diet the day after surgery. 5. Most patients will experience some swelling and bruising around the umbilicus or in the groin and scrotum.  Ice packs and reclining will help.  Swelling and bruising can take several days to resolve.  6. It is common to experience some constipation if taking pain medication after surgery.  Increasing fluid intake and taking a stool softener (such as Colace) will usually help or prevent this problem from occurring.  A mild laxative (Milk of Magnesia or Miralax) should be taken according to package directions if there are no bowel movements after 48 hours. 7. Unless discharge instructions indicate otherwise, you may remove your bandages 24-48 hours after surgery, and you may shower at that time.  You may have steri-strips (small skin tapes) in place directly over the incision.  These strips should be left on the skin for 7-10 days.  If your surgeon used skin glue on the  incision, you may shower in 24 hours.  The glue will flake off over the next 2-3 weeks.  Any sutures or staples will be removed at the office during your follow-up visit. 8. ACTIVITIES:  You may resume regular (light) daily activities beginning the next day--such as daily self-care, walking, climbing stairs--gradually increasing activities as tolerated.  You may have sexual intercourse when it is comfortable.  Refrain from any heavy lifting or straining until approved by your doctor. a. You may drive when you are no longer taking prescription pain medication, you can comfortably wear a seatbelt, and you can safely maneuver your car and apply brakes. b. RETURN TO WORK:  __________________________________________________________ 9. You should see your doctor in the office for a follow-up appointment approximately 2-3 weeks after your surgery.  Make sure that you call for this appointment within a day or two after you arrive home to insure a convenient appointment time. 10. OTHER INSTRUCTIONS: NO LIFTING MORE THAN 15 POUNDS FOR 3 WEEKS. NO LIFTING 90 POUNDS FOR 5 WEEKS _____________________________ 11. ICE PACK AND IBUPROFEN ALSO FOR PAIN  12. _____________________________________________________________________________________________________________________________________________________________  WHEN TO CALL YOUR DOCTOR: 1. Fever over 101.0 2. Inability to urinate 3. Nausea and/or vomiting 4. Extreme swelling or bruising 5. Continued bleeding from incision. 6. Increased pain, redness, or drainage from the incision  The clinic staff is available to answer your questions during regular business hours.  Please dont hesitate to call and ask to speak to one of the nurses for clinical concerns.  If you have a medical  emergency, go to the nearest emergency room or call 911.  A surgeon from [facility] is always on call at the hospital   [address], [place], [place], [place]   [address] ?  [address], [place], [place]   [address] [phone] ? [phone] ? FAX [phone] Web site: [email or web address]

## 2015-04-23 NOTE — Anesthesia Postprocedure Evaluation (Signed)
Anesthesia Post Note  Patient: Michael Herring  Procedure(s) Performed: Procedure(s) (LRB): LAPAROSCOPIC BILATERAL INGUINAL HERNIA REPAIR (Bilateral) INSERTION OF MESH (N/A) HERNIA REPAIR UMBILICAL ADULT (N/A)  Patient location during evaluation: PACU Anesthesia Type: General Level of consciousness: sedated and patient cooperative Pain management: pain level controlled Vital Signs Assessment: post-procedure vital signs reviewed and stable Respiratory status: spontaneous breathing Cardiovascular status: stable Anesthetic complications: no    Last Vitals:  Filed Vitals:   04/23/15 1237 04/23/15 1239  BP: 131/82 131/82  Pulse: 81   Temp:    Resp:      Last Pain:  Filed Vitals:   04/23/15 1327  PainSc: 2                  Nolon Nations

## 2015-04-23 NOTE — Anesthesia Preprocedure Evaluation (Signed)
Anesthesia Evaluation  Patient identified by MRN, date of birth, ID band Patient awake    Reviewed: Allergy & Precautions, NPO status , Patient's Chart, lab work & pertinent test results  Airway Mallampati: II  TM Distance: >3 FB Neck ROM: Full    Dental no notable dental hx.    Pulmonary neg pulmonary ROS, Current Smoker,    Pulmonary exam normal breath sounds clear to auscultation       Cardiovascular hypertension, Pt. on medications Normal cardiovascular exam+ dysrhythmias  Rhythm:Regular Rate:Normal     Neuro/Psych Seizures -,  PSYCHIATRIC DISORDERS TIAnegative neurological ROS  negative psych ROS   GI/Hepatic negative GI ROS, Neg liver ROS,   Endo/Other  negative endocrine ROS  Renal/GU negative Renal ROS     Musculoskeletal negative musculoskeletal ROS (+)   Abdominal   Peds  Hematology negative hematology ROS (+)   Anesthesia Other Findings   Reproductive/Obstetrics negative OB ROS                             Anesthesia Physical Anesthesia Plan  ASA: III  Anesthesia Plan: General   Post-op Pain Management:    Induction: Intravenous  Airway Management Planned: Oral ETT  Additional Equipment:   Intra-op Plan:   Post-operative Plan: Extubation in OR  Informed Consent: I have reviewed the patients History and Physical, chart, labs and discussed the procedure including the risks, benefits and alternatives for the proposed anesthesia with the patient or authorized representative who has indicated his/her understanding and acceptance.   Dental advisory given  Plan Discussed with: CRNA  Anesthesia Plan Comments:         Anesthesia Quick Evaluation

## 2015-04-23 NOTE — Op Note (Signed)
LAPAROSCOPIC BILATERAL INGUINAL HERNIA REPAIR, INSERTION OF MESH, HERNIA REPAIR UMBILICAL ADULT  Procedure Note  Michael Herring 04/23/2015   Pre-op Diagnosis: Bilateral inguinal hernias, umbilical hernia     Post-op Diagnosis: same  Procedure(s): LAPAROSCOPIC BILATERAL INGUINAL HERNIA REPAIR INSERTION OF MESH HERNIA REPAIR UMBILICAL ADULT  Surgeon(s): Coralie Keens, MD  Anesthesia: General  Staff:  Circulator: Murlean Caller Relief Circulator: Shelva Majestic, RN Scrub Person: Adella Hare; Harrel Lemon, RN Circulator Assistant: Harrel Lemon, RN  Estimated Blood Loss: Minimal                         [name]   Date: 04/23/2015  Time: 11:41 AM

## 2015-04-23 NOTE — Transfer of Care (Signed)
Immediate Anesthesia Transfer of Care Note  Patient: Michael Herring  Procedure(s) Performed: Procedure(s): LAPAROSCOPIC BILATERAL INGUINAL HERNIA REPAIR (Bilateral) INSERTION OF MESH (N/A) HERNIA REPAIR UMBILICAL ADULT (N/A)  Patient Location: PACU  Anesthesia Type:General  Level of Consciousness:  sedated, patient cooperative and responds to stimulation  Airway & Oxygen Therapy:Patient Spontanous Breathing and Patient connected to face mask oxgen  Post-op Assessment:  Report given to PACU RN and Post -op Vital signs reviewed and stable  Post vital signs:  Reviewed and stable  Last Vitals:  Filed Vitals:   04/23/15 1012  BP: 225/68  Pulse: 74  Temp: 36.6 C  Resp: 18    Complications: No apparent anesthesia complications

## 2015-04-23 NOTE — Op Note (Signed)
NAMEJERRID, CABBAGE NO.:  [id number]  MEDICAL RECORD NO.:  [id number]  LOCATION:  [facility]                         FACILITY:  [facility]  PHYSICIAN:  Coralie Keens, M.D. DATE OF BIRTH:  05/09/1965  DATE OF PROCEDURE:  04/23/2015 DATE OF DISCHARGE:                              OPERATIVE REPORT   PREOPERATIVE DIAGNOSES: 1. Bilateral inguinal hernias. 2. Umbilical hernia.  POSTOPERATIVE DIAGNOSES: 1. Bilateral inguinal hernias. 2. Umbilical hernia.  PROCEDURE: 1. Bilateral laparoscopic inguinal hernia repair with mesh. 2. Umbilical hernia repair.  SURGEON:  Coralie Keens, M.D.  ANESTHESIA:  General with 0.25% Marcaine.  ESTIMATED BLOOD LOSS:  Minimal.  FINDINGS:  The patient was found to have bilateral direct inguinal hernias with the right being larger than the left as well as a very small umbilical hernia.  The inguinal hernias were repaired with 2 separate pieces of Bard 3DMax large Prolene mesh.  I did not use mesh at the umbilicus.  PROCEDURE IN DETAIL:  The patient was brought to the operating room, identified as Michael Herring.  He was placed supine on the operating room table and general anesthesia was induced.  His abdomen was then prepped and draped in usual sterile fashion.  I made a small vertical incision at the lower edge of the umbilicus.  I carried this down to the fascia, which opened just to the right of the midline.  The rectus muscles were identified and elevated.  I passed the dissecting balloon underneath the rectus muscle.  Manipulated toward the pubis.  I then insufflated the dissecting balloon under direct vision, dissecting out the preperitoneal space.  Excellent dissection appeared to be achieved. I then removed the balloon port and insufflation was begun with carbon dioxide.  I placed two 5 mm ports in the patient's lower midline both under direct vision.  I dissected out the right inguinal area first. The patient had a  moderate-sized direct hernia and I was able to reduce the contents from this easily.  I examined the testicular cord and felt no evidence of indirect hernia.  I likewise then examined the left inguinal floor and he had about small direct hernia defect there with no evidence of indirect hernia.  I then brought a piece of left-sided Bard 3DMax Prolene mesh onto the field.  I placed it through the umbilical port and opened it normally on the left inguinal floor.  I tacked it to Cooper's ligament up the medial abdominal wall and slightly laterally with the absorbable tacker.  I then brought a right-sided piece of large Bard Prolene 3DMax mesh on to the field as well.  I placed it through the umbilical port and opened it as an onlay on the right inguinal floor.  I then tacked it to Cooper's ligament up the medial abdominal wall and slightly laterally as well.  Wide coverage of the cord structures and direct defects appeared to be achieved.  I then removed the midline ports and saw the preperitoneal space collapse appropriately with the mesh in place.  I then removed the umbilical port.  I then closed the very small fascial defect at the umbilicus with a 0 Vicryl suture.  I then closed the  fascia, just below this with 0 Vicryl suture as well.  All incisions were anesthetized with Marcaine.  I performed bilateral ilioinguinal nerve blocks with Marcaine as well.  All skin incisions then closed with 4-0 Monocryl and skin glue.  The patient tolerated the procedure well.  All the counts were correct at the end of procedure.  The patient was then extubated in the operating room and taken in a stable condition to the recovery room.     Coralie Keens, M.D.     DB/MEDQ  D:  04/23/2015  T:  04/23/2015  Job:  [id number]

## 2015-04-24 ENCOUNTER — Encounter (HOSPITAL_COMMUNITY): Payer: Self-pay | Admitting: Surgery

## 2015-04-25 ENCOUNTER — Encounter (HOSPITAL_COMMUNITY): Payer: Self-pay | Admitting: Surgery

## 2015-04-29 ENCOUNTER — Other Ambulatory Visit: Payer: Self-pay

## 2015-04-29 DIAGNOSIS — I701 Atherosclerosis of renal artery: Secondary | ICD-10-CM

## 2015-05-01 ENCOUNTER — Inpatient Hospital Stay (HOSPITAL_COMMUNITY): Admission: RE | Admit: 2015-05-01 | Payer: Self-pay | Source: Ambulatory Visit

## 2015-05-06 ENCOUNTER — Encounter (HOSPITAL_COMMUNITY): Payer: Self-pay

## 2015-05-13 ENCOUNTER — Ambulatory Visit (HOSPITAL_COMMUNITY)
Admission: RE | Admit: 2015-05-13 | Discharge: 2015-05-13 | Disposition: A | Discharge status: Home / Self Care | Payer: BLUE CROSS/BLUE SHIELD | Source: Ambulatory Visit | Attending: Vascular Surgery | Admitting: Vascular Surgery

## 2015-05-13 ENCOUNTER — Other Ambulatory Visit: Payer: Self-pay | Admitting: Family Medicine

## 2015-05-13 DIAGNOSIS — I10 Essential (primary) hypertension: Secondary | ICD-10-CM | POA: Insufficient documentation

## 2015-05-13 DIAGNOSIS — I701 Atherosclerosis of renal artery: Secondary | ICD-10-CM | POA: Diagnosis not present

## 2015-06-02 ENCOUNTER — Telehealth: Payer: Self-pay | Admitting: Family Medicine

## 2015-06-02 NOTE — Telephone Encounter (Signed)
Stp and called pharmacy to cancel the lisinopril rx. Pt is aware to go back to pharmacy to get the correct rx.

## 2015-06-26 ENCOUNTER — Other Ambulatory Visit: Payer: Self-pay

## 2015-06-26 DIAGNOSIS — J208 Acute bronchitis due to other specified organisms: Secondary | ICD-10-CM

## 2015-06-26 MED ORDER — ALBUTEROL SULFATE HFA 108 (90 BASE) MCG/ACT IN AERS: 2.0000 | INHALATION_SPRAY | Freq: Four times a day (QID) | RESPIRATORY_TRACT | Status: DC | PRN

## 2015-07-14 ENCOUNTER — Encounter: Payer: Self-pay | Admitting: Family Medicine

## 2015-07-14 ENCOUNTER — Ambulatory Visit (INDEPENDENT_AMBULATORY_CARE_PROVIDER_SITE_OTHER): Payer: BLUE CROSS/BLUE SHIELD | Admitting: Family Medicine

## 2015-07-14 VITALS — BP 135/84 | HR 81 | Temp 97.3°F | Ht 70.0 in | Wt 236.6 lb

## 2015-07-14 DIAGNOSIS — F17200 Nicotine dependence, unspecified, uncomplicated: Secondary | ICD-10-CM | POA: Diagnosis not present

## 2015-07-14 DIAGNOSIS — J208 Acute bronchitis due to other specified organisms: Secondary | ICD-10-CM | POA: Diagnosis not present

## 2015-07-14 DIAGNOSIS — R059 Cough, unspecified: Secondary | ICD-10-CM | POA: Insufficient documentation

## 2015-07-14 DIAGNOSIS — R0683 Snoring: Secondary | ICD-10-CM | POA: Diagnosis not present

## 2015-07-14 DIAGNOSIS — R05 Cough: Secondary | ICD-10-CM | POA: Insufficient documentation

## 2015-07-14 MED ORDER — ALBUTEROL SULFATE HFA 108 (90 BASE) MCG/ACT IN AERS: 2.0000 | INHALATION_SPRAY | Freq: Four times a day (QID) | RESPIRATORY_TRACT | Status: DC | PRN

## 2015-07-14 NOTE — Patient Instructions (Signed)
   Great to see you!  I think you would benefit greatly from quitting smoking and see a pulmonologist  Call 1800 Quit Now, Start patches on the quit date Start with 21 mg nicotine poatch for 6 weeks Then go down to 14 mg for 2 weeks Then 7 mg for 2 weeks Then quit  You will be called for a pulmonology appointment

## 2015-07-14 NOTE — Progress Notes (Signed)
   HPI  Patient presents today care follow up for cough  Pt states that he's feeling better, he has struggled with a cough for about 2 months which has recently resolved. He states that he's  Using albuterol for the last few months maybe 1-3 times per week. It seems to be needed more whenever he is active. He denies any shortness of breath. He is motivated to quit smoking and has questions about this.  He denies any chest pain He smokes about 1-1/2 packs per day. 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, however he has no formal exercise routine   PMH: Smoking status noted ROS: Per HPI  Objective: BP 135/84 mmHg  Pulse 81  Temp(Src) 97.3 F (36.3 C) (Oral)  Ht 5\' 10"  (1.778 m)  Wt 236 lb 9.6 oz (107.321 kg)  BMI 33.95 kg/m2 Gen: NAD, alert, cooperative with exam HEENT: NCAT CV: RRR, good S1/S2, no murmur Resp: CTABL, no wheezes, non-labored Ext: No edema, warm Neuro: Alert and oriented, No gross deficits  Assessment and plan: # Cough Intermittent cough responsive to albuterol, likely mild underlying COPD Refilled albuterol, has used 2 inhalers in 6 months Discussed at length tobacco cessation today. Pulmonology referral, please consider PFTs  # Snoring 2-3 year plus Hx of loud snoring, works 3rd shift but never feels rested Stop bang score peri-operatively was 5 Refer to pulm for PFTs with cough as well as consideration for sleep study  # Tobacco abuse Discussed 1800 Quit now + the patch Consider chantix or wellbutrin if fails    Orders Placed This Encounter  Procedures  . Ambulatory referral to Pulmonology    Referral Priority:  Routine    Referral Type:  Consultation    Referral Reason:  Specialty Services Required    Requested Specialty:  Pulmonary Disease    Number of Visits Requested:  1    Meds ordered this encounter  Medications  . albuterol (PROVENTIL HFA;VENTOLIN HFA) 108 (90  Base) MCG/ACT inhaler    Sig: Inhale 2 puffs into the lungs every 6 (six) hours as needed for wheezing or shortness of breath.    Dispense:  1 Inhaler    Refill:  Heil, MD [facility] 07/14/2015, 8:52 AM

## 2015-08-07 ENCOUNTER — Encounter: Payer: Self-pay | Admitting: Pulmonary Disease

## 2015-08-07 ENCOUNTER — Ambulatory Visit (INDEPENDENT_AMBULATORY_CARE_PROVIDER_SITE_OTHER): Payer: BLUE CROSS/BLUE SHIELD | Admitting: Pulmonary Disease

## 2015-08-07 VITALS — BP 136/76 | HR 65 | Temp 97.9°F | Ht 70.0 in | Wt 232.6 lb

## 2015-08-07 DIAGNOSIS — R05 Cough: Secondary | ICD-10-CM

## 2015-08-07 DIAGNOSIS — R059 Cough, unspecified: Secondary | ICD-10-CM

## 2015-08-07 NOTE — Patient Instructions (Signed)
We will schedule you for lung function tests.  Return to clinic after the test to discuss results and further workup is necessary.
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
## 2015-08-20 ENCOUNTER — Ambulatory Visit (HOSPITAL_COMMUNITY)
Admission: RE | Admit: 2015-08-20 | Discharge: 2015-08-20 | Disposition: A | Discharge status: Home / Self Care | Payer: BLUE CROSS/BLUE SHIELD | Source: Ambulatory Visit | Attending: Pulmonary Disease | Admitting: Pulmonary Disease

## 2015-08-20 DIAGNOSIS — R059 Cough, unspecified: Secondary | ICD-10-CM

## 2015-08-20 DIAGNOSIS — R05 Cough: Secondary | ICD-10-CM | POA: Diagnosis present

## 2015-08-20 LAB — PULMONARY FUNCTION TEST
DL/VA % pred: 79 %
DL/VA: 3.69 ml/min/mmHg/L
DLCO UNC % PRED: 80 %
DLCO UNC: 26.01 ml/min/mmHg
FEF 25-75 PRE: 2.96 L/s
FEF 25-75 Post: 4.26 L/sec
FEF2575-%CHANGE-POST: 44 %
FEF2575-%PRED-PRE: 84 %
FEF2575-%Pred-Post: 121 %
FEV1-%Change-Post: 8 %
FEV1-%PRED-POST: 87 %
FEV1-%Pred-Pre: 80 %
FEV1-Post: 3.46 L
FEV1-Pre: 3.18 L
FEV1FVC-%CHANGE-POST: 5 %
FEV1FVC-%Pred-Pre: 99 %
FEV6-%CHANGE-POST: 2 %
FEV6-%Pred-Post: 85 %
FEV6-%Pred-Pre: 83 %
FEV6-Post: 4.19 L
FEV6-Pre: 4.07 L
FEV6FVC-%CHANGE-POST: 0 %
FEV6FVC-%Pred-Post: 104 %
FEV6FVC-%Pred-Pre: 103 %
FVC-%CHANGE-POST: 3 %
FVC-%Pred-Post: 83 %
FVC-%Pred-Pre: 80 %
FVC-Post: 4.23 L
FVC-Pre: 4.09 L
POST FEV1/FVC RATIO: 82 %
POST FEV6/FVC RATIO: 100 %
PRE FEV1/FVC RATIO: 78 %
Pre FEV6/FVC Ratio: 100 %
RV % pred: 98 %
RV: 1.98 L
TLC % pred: 88 %
TLC: 6.13 L

## 2015-08-20 MED ORDER — ALBUTEROL SULFATE (2.5 MG/3ML) 0.083% IN NEBU
2.5000 mg | INHALATION_SOLUTION | Freq: Once | RESPIRATORY_TRACT | Status: AC
  Administered 2015-08-20: 2.5 mg via RESPIRATORY_TRACT

## 2015-08-28 ENCOUNTER — Encounter: Payer: Self-pay | Admitting: Family Medicine

## 2015-08-28 ENCOUNTER — Ambulatory Visit (INDEPENDENT_AMBULATORY_CARE_PROVIDER_SITE_OTHER): Payer: BLUE CROSS/BLUE SHIELD | Admitting: Family Medicine

## 2015-08-28 VITALS — BP 132/91 | HR 75 | Temp 97.2°F | Ht 70.0 in | Wt 238.6 lb

## 2015-08-28 DIAGNOSIS — R05 Cough: Secondary | ICD-10-CM

## 2015-08-28 DIAGNOSIS — I10 Essential (primary) hypertension: Secondary | ICD-10-CM | POA: Diagnosis not present

## 2015-08-28 DIAGNOSIS — R059 Cough, unspecified: Secondary | ICD-10-CM

## 2015-08-28 DIAGNOSIS — F17200 Nicotine dependence, unspecified, uncomplicated: Secondary | ICD-10-CM

## 2015-08-28 NOTE — Progress Notes (Signed)
   HPI  Patient presents today in follow-up for hypertension, smoking, and review of recent PFTs.  His recent PFTs appear to be normal, however awaiting for pulmonology interpretation.  He has been cutting back on smoking and his cough is improved. He has quit for a few days but then returned and is currently smoking 5 cigarettes per day.  His blood pressure has been in the [id number] and [id number] systolic at recent checks. He denies chest pain, shortness of breath, palpitations, and leg edema.  He has good medication compliance with amlodipine  PMH: Smoking status noted ROS: Per HPI  Objective: BP 132/91 mmHg  Pulse 75  Temp(Src) 97.2 F (36.2 C) (Oral)  Ht 5\' 10"  (1.778 m)  Wt 238 lb 9.6 oz (108.228 kg)  BMI 34.24 kg/m2 Gen: NAD, alert, cooperative with exam HEENT: NCAT CV: RRR, good S1/S2, no murmur Resp: CTABL, no wheezes, non-labored Ext: No edema, warm Neuro: Alert and oriented, No gross deficits  Assessment and plan:  # Hypertension Slightly elevated today, however recent checks have been normal. Continue amlodipine at low-dose Continue to monitor  # Smoking, tobacco use disorder Congratulated him for his success, recommended pushing harder and finishing completely quitting  # Chronic cough Improvement with cutting back on smoking, PFTs appear normal, will discuss with pulmonology He has f/u for OSA eval in about 1 month    Laroy Apple, MD [facility] 08/28/2015, 9:31 AM

## 2015-08-28 NOTE — Patient Instructions (Signed)
Great to see you!  Lets see you again in 6 months, I think everything is looking good.   Your blood pressure has been better lately than today, so I agree you do not need additional medicine for now   [place] job cutting back on smoking, keep pushing, years from now you will be glad you quit!

## 2015-09-04 ENCOUNTER — Institutional Professional Consult (permissible substitution): Payer: Self-pay | Admitting: Pulmonary Disease

## 2015-09-11 ENCOUNTER — Ambulatory Visit (INDEPENDENT_AMBULATORY_CARE_PROVIDER_SITE_OTHER): Payer: BLUE CROSS/BLUE SHIELD | Admitting: Family Medicine

## 2015-09-11 ENCOUNTER — Encounter: Payer: Self-pay | Admitting: Family Medicine

## 2015-09-11 VITALS — BP 152/103 | HR 80 | Temp 97.8°F | Ht 70.0 in | Wt 233.4 lb

## 2015-09-11 DIAGNOSIS — R1030 Lower abdominal pain, unspecified: Secondary | ICD-10-CM | POA: Diagnosis not present

## 2015-09-11 DIAGNOSIS — N400 Enlarged prostate without lower urinary tract symptoms: Secondary | ICD-10-CM | POA: Insufficient documentation

## 2015-09-11 MED ORDER — TRAMADOL HCL 50 MG PO TABS: 50.0000 mg | ORAL_TABLET | Freq: Three times a day (TID) | ORAL | Status: DC | PRN

## 2015-09-11 MED ORDER — TAMSULOSIN HCL 0.4 MG PO CAPS: 0.4000 mg | ORAL_CAPSULE | Freq: Every day | ORAL | Status: DC

## 2015-09-11 NOTE — Patient Instructions (Signed)
Great to see you!  Try the flomax for urinary symptoms  Try tramadol, aleve, and rest for your groin pain.   If you have worsening pain, a large swelling in the groin, redness of the groin, or other symptoms concerning for recurrence of hernia or incarcerated hernia like we discussed please seek medical care.

## 2015-09-11 NOTE — Progress Notes (Signed)
   HPI  Patient presents today here with groin pain and urinary frequency.  groin pain He was using a sledgehammer earlier this weekend and developed bilateral lower groin pain at the site of his previous hernia surgery which was 4 months ago This pain has been bothersome off and on since that time. One day ago he was stepping over tools in his shop and effectively do split causing bilateral groin pain between his legs under his testicles. He denies any swelling, redness, warmth, or any unbearable pain.  He had some hydrocodone left over after surgery which she tried for pain last night which helped quite a bit.  BPH symptoms Urinating frequently with a weak stream and incomplete voiding. Nocturia more than 5 times a night Has been on for months and seems to be steadily worsening  PMH: Smoking status noted ROS: Per HPI  Objective: BP 152/103 mmHg  Pulse 80  Temp(Src) 97.8 F (36.6 C) (Oral)  Ht 5\' 10"  (1.778 m)  Wt 233 lb 6.4 oz (105.87 kg)  BMI 33.49 kg/m2 Gen: NAD, alert, cooperative with exam HEENT: NCAT CV: RRR, good S1/S2, no murmur Resp: CTABL, no wheezes, non-labored Ext: No edema, warm Neuro: Alert and oriented, strength with hip abduction and abduction 5/5, no tenderness of the groin at that time. GU Very tiny bulge felt in the inguinal canal on the left which is not definitely convincing for recurrence of hernia Right-sided inguinal canal normal No persistent bulge  Assessment and plan:  # groin pain  Multifactorial First due to overexertion after hernia surgery No clear evidence of recurrence of hernia He is very familiar with hernias now and will seek medical care if he has any signs of incarceration I believe he has strained his hip abductors with his "split" Recommend NSAIDs, ice, rest, and tramadol for persistent pain  # BPH Symptoms consistent, start Flomax Check PSA     Orders Placed This Encounter  Procedures  . PSA    Meds ordered this  encounter  Medications  . traMADol (ULTRAM) 50 MG tablet    Sig: Take 1 tablet (50 mg total) by mouth every 8 (eight) hours as needed.    Dispense:  30 tablet    Refill:  0  . tamsulosin (FLOMAX) 0.4 MG CAPS capsule    Sig: Take 1 capsule (0.4 mg total) by mouth daily.    Dispense:  30 capsule    Refill:  [place], MD [place] 09/11/2015, 9:27 AM

## 2015-09-12 LAB — PSA: Prostate Specific Ag, Serum: 0.7 ng/mL (ref 0.0–4.0)

## 2015-09-15 DIAGNOSIS — Z029 Encounter for administrative examinations, unspecified: Secondary | ICD-10-CM

## 2015-12-18 ENCOUNTER — Telehealth: Payer: Self-pay | Admitting: Family Medicine

## 2015-12-18 NOTE — Telephone Encounter (Signed)
error 

## 2016-02-18 ENCOUNTER — Ambulatory Visit (INDEPENDENT_AMBULATORY_CARE_PROVIDER_SITE_OTHER): Payer: BLUE CROSS/BLUE SHIELD

## 2016-02-18 ENCOUNTER — Encounter: Payer: Self-pay | Admitting: Family Medicine

## 2016-02-18 ENCOUNTER — Ambulatory Visit (INDEPENDENT_AMBULATORY_CARE_PROVIDER_SITE_OTHER): Payer: BLUE CROSS/BLUE SHIELD | Admitting: Family Medicine

## 2016-02-18 VITALS — BP 140/93 | HR 85 | Temp 97.2°F | Ht 70.0 in | Wt 233.0 lb

## 2016-02-18 DIAGNOSIS — R1032 Left lower quadrant pain: Secondary | ICD-10-CM

## 2016-02-18 MED ORDER — CYCLOBENZAPRINE HCL 10 MG PO TABS: 10.0000 mg | ORAL_TABLET | Freq: Every day | ORAL | 0 refills | Status: DC

## 2016-02-18 NOTE — Progress Notes (Signed)
   Subjective:    Patient ID: Michael Herring, male    DOB: [birth year], 50 y.o.   MRN: [id number]  HPI 50 year old male with two-week history of left lower quadrant abdominal pain. He has had no change in bowel habits. He does have a history of diverticular disease about 10 years ago. He watches his diet and tries to avoid things with seeds peanuts the like. The pain is described as a quit which he twisting feeling in his abdomen. 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  Patient Active Problem List   Diagnosis Date Noted  . BPH (benign prostatic hyperplasia) 09/11/2015  . Snoring 07/14/2015  . Cough 07/14/2015  . Inguinal hernia 03/18/2015  . HTN (hypertension) 02/28/2015  . TIA (transient ischemic attack) 01/27/2015  . Atypical chest pain 01/27/2015  . Tobacco use disorder 01/27/2015  . Pleuritic chest pain 01/27/2015   Outpatient Encounter Prescriptions as of 02/18/2016  Medication Sig  . amLODipine (NORVASC) 2.5 MG tablet Take 1 tablet (2.5 mg total) by mouth daily.  Marland Kitchen aspirin EC 81 MG tablet Take 1 tablet (81 mg total) by mouth daily.  . tamsulosin (FLOMAX) 0.4 MG CAPS capsule Take 1 capsule (0.4 mg total) by mouth daily.  . traMADol (ULTRAM) 50 MG tablet Take 1 tablet (50 mg total) by mouth every 8 (eight) hours as needed. (Patient not taking: Reported on 02/18/2016)   No facility-administered encounter medications on file as of 02/18/2016.       Review of Systems  Respiratory: Negative.   Cardiovascular: Negative.   Gastrointestinal: Positive for abdominal pain.  Genitourinary: Negative.   Psychiatric/Behavioral: Negative.        Objective:   Physical Exam  Constitutional: He appears well-developed and well-nourished.  Cardiovascular: Normal rate.   Pulmonary/Chest: Effort normal and breath sounds normal.  Abdominal: Soft. He exhibits no distension and no mass. There is no tenderness. There is no rebound and no guarding.    BP (!) 140/93   Pulse 85   Temp 97.2 F (36.2 C) (Oral)   Ht 5\' 10"  (1.778 m)   Wt 233 lb (105.7 kg)   BMI 33.43 kg/m         Assessment & Plan:  1. Abdominal pain, left lower quadrant Question is abdominal muscular pain versus diverticular disease. I think exam and symptoms fit more with strain abdominal wall muscles. Treat with application of local heat and muscle relaxant. X-ray is totally normal. If symptoms change or he develops some other bowel related symptoms let us know.  Wardell Honour MD - DG Abd 2 Views; Future

## 2016-02-23 ENCOUNTER — Telehealth: Payer: Self-pay | Admitting: Family Medicine

## 2016-02-23 DIAGNOSIS — Z029 Encounter for administrative examinations, unspecified: Secondary | ICD-10-CM

## 2016-02-23 NOTE — Telephone Encounter (Signed)
Note updated added to say with no restrictions

## 2016-02-24 ENCOUNTER — Other Ambulatory Visit: Payer: Self-pay | Admitting: Family Medicine

## 2016-02-24 DIAGNOSIS — N400 Enlarged prostate without lower urinary tract symptoms: Secondary | ICD-10-CM

## 2016-02-27 ENCOUNTER — Ambulatory Visit: Payer: BLUE CROSS/BLUE SHIELD | Admitting: Family Medicine

## 2016-03-01 ENCOUNTER — Encounter: Payer: Self-pay | Admitting: Family Medicine

## 2016-03-09 ENCOUNTER — Other Ambulatory Visit: Payer: Self-pay | Admitting: Family Medicine

## 2016-05-18 ENCOUNTER — Other Ambulatory Visit: Payer: Self-pay | Admitting: Family Medicine

## 2016-05-18 DIAGNOSIS — I10 Essential (primary) hypertension: Secondary | ICD-10-CM

## 2016-08-15 ENCOUNTER — Other Ambulatory Visit: Payer: Self-pay | Admitting: Family Medicine

## 2016-08-15 DIAGNOSIS — I10 Essential (primary) hypertension: Secondary | ICD-10-CM
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
## 2017-07-11 ENCOUNTER — Ambulatory Visit: Payer: PRIVATE HEALTH INSURANCE | Admitting: *Deleted

## 2017-07-11 VITALS — BP 133/83 | HR 88

## 2017-07-11 DIAGNOSIS — Z0130 Encounter for examination of blood pressure without abnormal findings: Secondary | ICD-10-CM

## 2017-07-12 ENCOUNTER — Ambulatory Visit (INDEPENDENT_AMBULATORY_CARE_PROVIDER_SITE_OTHER): Payer: PRIVATE HEALTH INSURANCE | Admitting: Family Medicine

## 2017-07-12 ENCOUNTER — Encounter: Payer: Self-pay | Admitting: Family Medicine

## 2017-07-12 VITALS — BP 153/97 | HR 87 | Temp 97.8°F | Ht 70.0 in | Wt 226.6 lb

## 2017-07-12 DIAGNOSIS — I10 Essential (primary) hypertension: Secondary | ICD-10-CM

## 2017-07-12 DIAGNOSIS — R42 Dizziness and giddiness: Secondary | ICD-10-CM | POA: Diagnosis not present

## 2017-07-12 DIAGNOSIS — F17200 Nicotine dependence, unspecified, uncomplicated: Secondary | ICD-10-CM

## 2017-07-12 MED ORDER — AMLODIPINE BESYLATE 5 MG PO TABS: 5.0000 mg | ORAL_TABLET | Freq: Every day | ORAL | 3 refills | Status: DC

## 2017-07-12 NOTE — Patient Instructions (Signed)
Great to see you!  Come back for fasting labs ( skip breakfast) within 2 weeks

## 2017-07-12 NOTE — Progress Notes (Signed)
   HPI  Patient presents today here for dizziness.  Patient explains that yesterday he began having dizziness described as a floating sensation.  He denies room spinning. 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 He checked his blood pressure and it was elevated in the 150s over 90s. He was seen in our clinic for nurse visit and blood pressure was found to be 138/88.  Patient states that dizziness lasted most of the day, along with blurred vision, and then has improved today. He had a mild amount of dizziness this morning which has mostly resolved. He denies any unilateral numbness, tingling, or weakness.  He states that he was thinking clearly, had no problem speaking, and had no problem eating or drinking.  He states that he missed his aspirin yesterday. He does have a history of TIA.   PMH: Smoking status noted ROS: Per HPI  Objective: BP (!) 153/97   Pulse 87   Temp 97.8 F (36.6 C) (Oral)   Ht _0  (1.778 m)   Wt 226 lb 9.6 oz (102.8 kg)   BMI 32.51 kg/m  Gen: NAD, alert, cooperative with exam HEENT: NCAT, EOMI, PERRL CV: RRR, good S1/S2, no murmur Resp: CTABL, no wheezes, non-labored Ext: No edema, warm Neuro: Alert and oriented, strength 5/5 and sensation intact in all 4 extremities, cranial nerves II through XII intact  Assessment and plan:  #Dizziness Possible recurrent TIA, however no discrete findings except for blurred vision which have improved. Continue aspirin for prophylaxis, considered escalating the Plavix, however with some uncertainty have decided to continue only aspirin. Restart antihypertensive Fasting labs, he will return for these  #Hypertension Elevated today Restart amlodipine, previously on 2.5 mg, discussed systolic goal of 540, restart at 5 mg.  #Tobacco use Recommended cessation firmly, he will consider   Orders Placed This Encounter    Procedures  . CMP14+EGFR    Standing Status:   Future    Standing Expiration Date:   07/13/2018  . CBC with Differential/Platelet    Standing Status:   Future    Standing Expiration Date:   07/13/2018  . Lipid panel    Standing Status:   Future    Standing Expiration Date:   07/13/2018  . TSH    Standing Status:   Future    Standing Expiration Date:   07/13/2018    Meds ordered this encounter  Medications  . amLODipine (NORVASC) 5 MG tablet    Sig: Take 1 tablet (5 mg total) by mouth daily.    Dispense:  90 tablet    Refill:  [place], MD [place] 07/12/2017, 9:57 AM

## 2017-08-29 IMAGING — US US CAROTID DUPLEX BILAT
1 series · 13 of 24 positions shown · non-contrast
Comparison: None.

CLINICAL DATA: TIA, tobacco abuse

EXAM:
BILATERAL CAROTID DUPLEX ULTRASOUND
TECHNIQUE: Gray scale imaging, color Doppler and duplex ultrasound was
performed of bilateral carotid and vertebral arteries in the neck.

[Series 1: us carotid duplex bilat · 0.07mm/px · 13 of 72 slices shown]
[im 1/72]
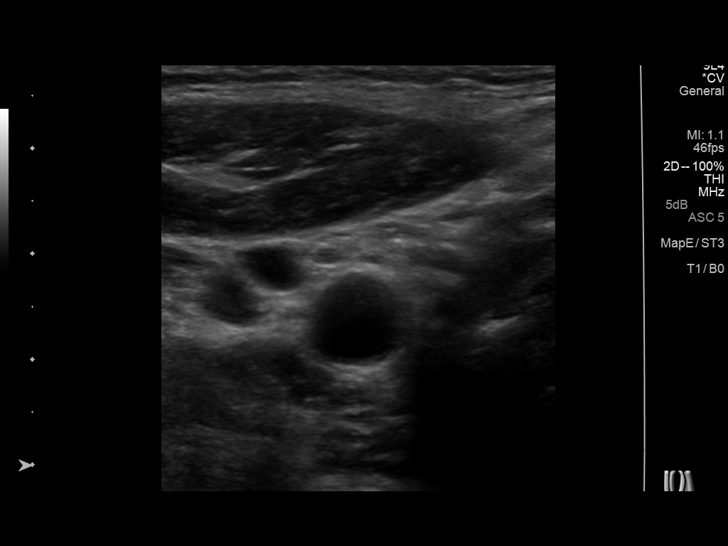
[im 7/72]
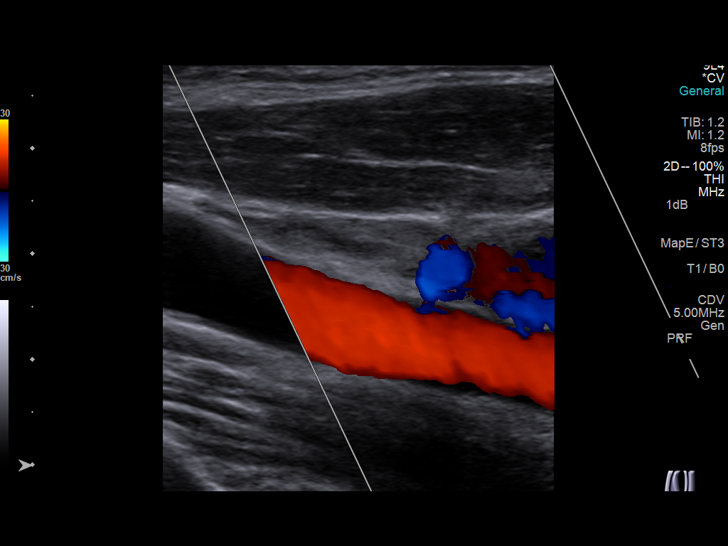
[im 13/72]
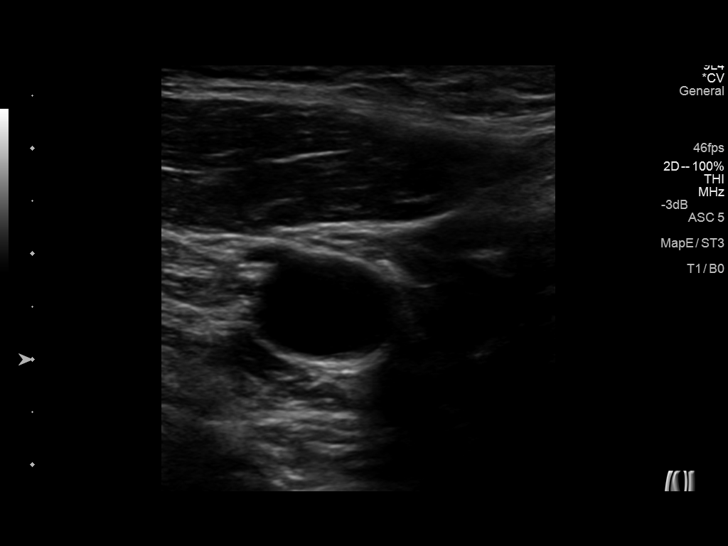
[im 19/72]
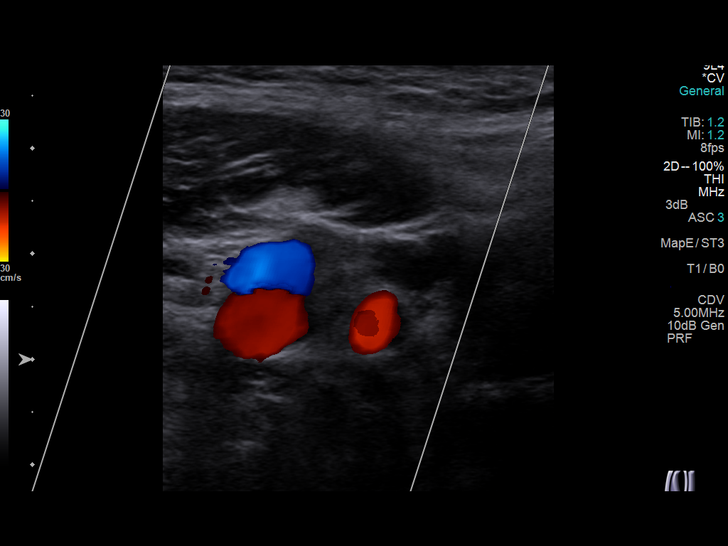
[im 25/72]
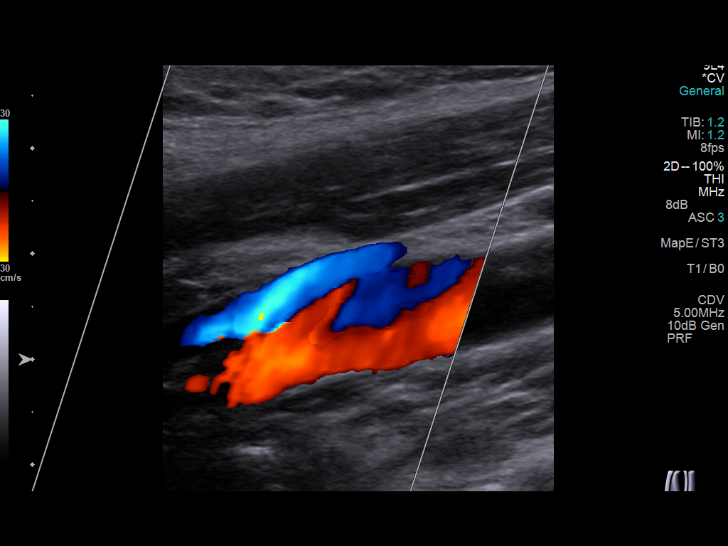
[im 31/72]
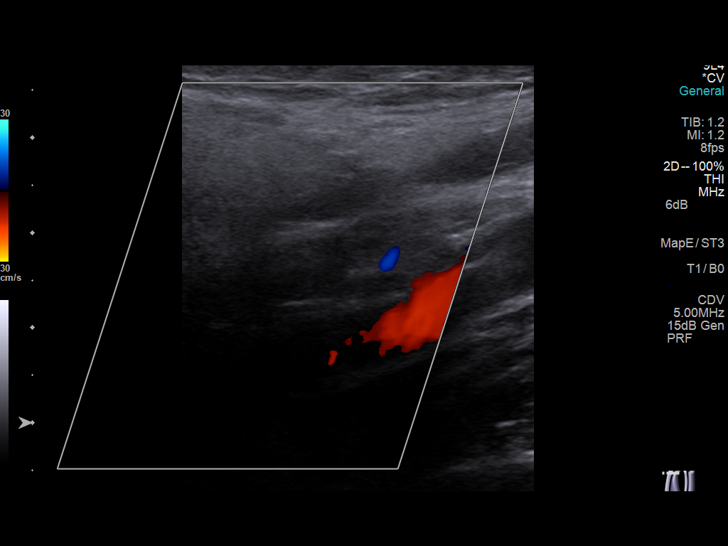
[im 38/72]
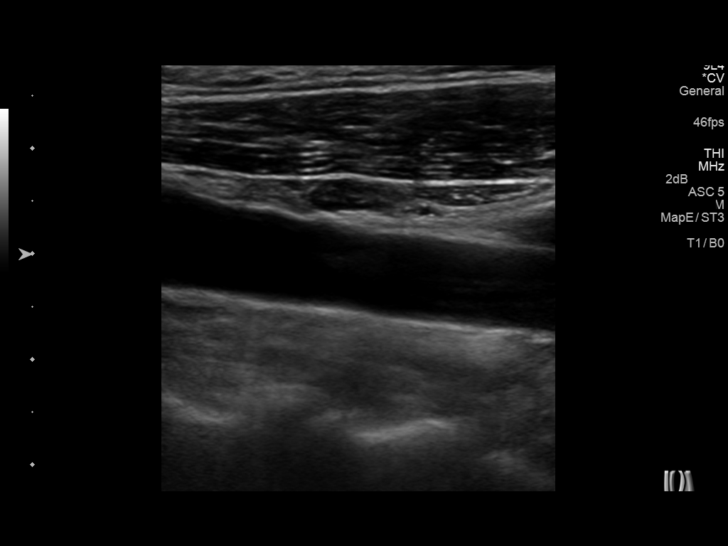
[im 41/72]
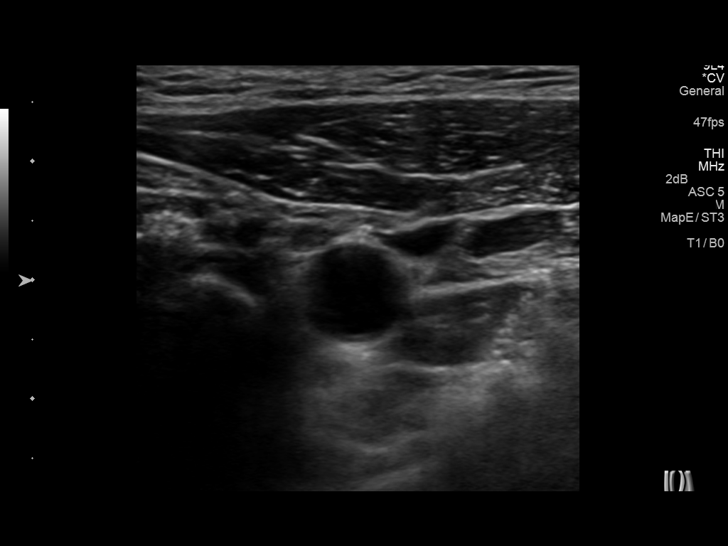
[im 47/72]
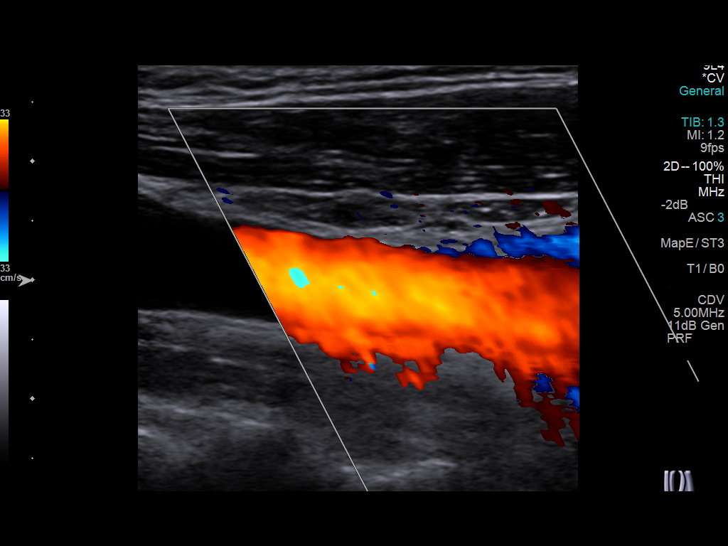
[im 53/72]
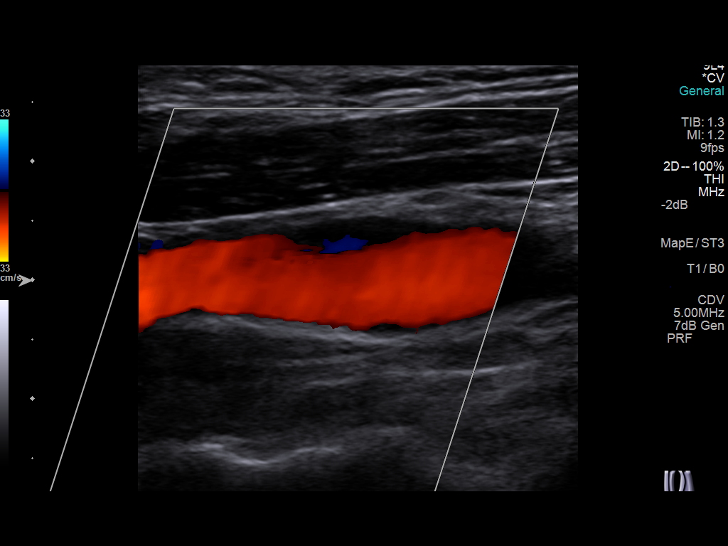
[im 59/72]
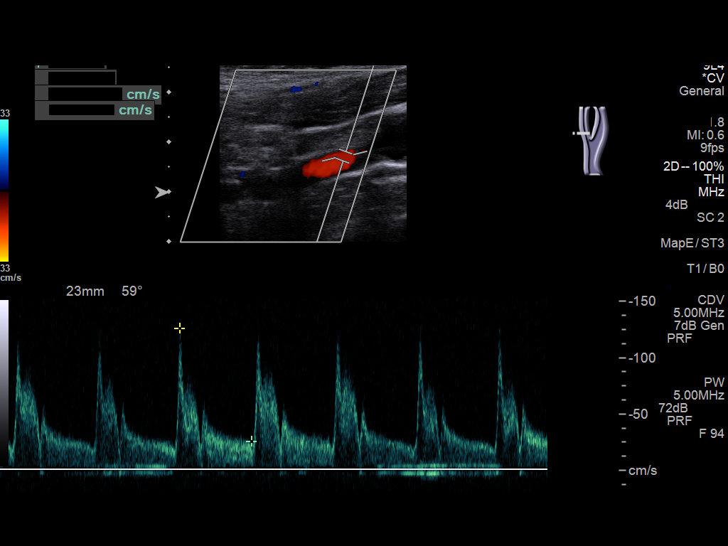
[im 65/72]
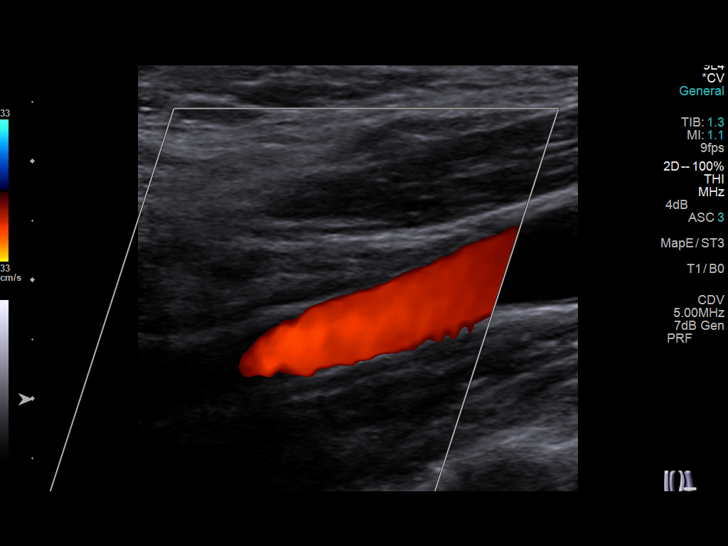
[im 72/72]
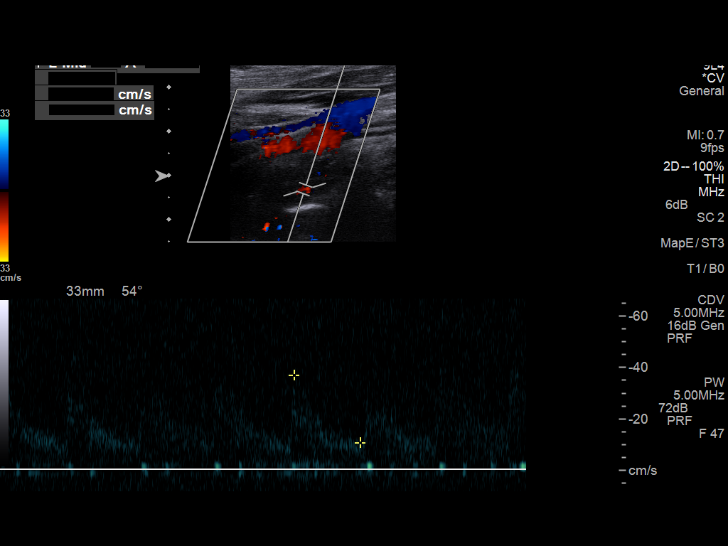

[13 of 24 positions shown; findings below may reference images not displayed]

REVIEW OF SYSTEMS:
Quantification of carotid stenosis is based on velocity parameters
that correlate the residual internal carotid diameter with
NASCET-based stenosis levels, using the diameter of the distal
internal carotid lumen as the denominator for stenosis measurement.

The following velocity measurements were obtained:

PEAK SYSTOLIC/END DIASTOLIC

RIGHT

ICA:                     70/23cm/sec

CCA:                     106/21cm/sec

SYSTOLIC ICA/CCA RATIO:

DIASTOLIC ICA/CCA RATIO:

ECA:                     98cm/sec

LEFT

ICA:                     72/25cm/sec

CCA:                     89/23cm/sec

SYSTOLIC ICA/CCA RATIO:

DIASTOLIC ICA/CCA RATIO:

ECA:                     126cm/sec
FINDINGS: RIGHT CAROTID ARTERY: No significant plaque or stenosis. Normal
waveforms and color Doppler signal.

RIGHT VERTEBRAL ARTERY:  Normal flow direction and waveform.

LEFT CAROTID ARTERY: No significant plaque or stenosis. Normal
waveforms and color Doppler signal.

LEFT VERTEBRAL ARTERY: Normal flow direction and waveform.
IMPRESSION: 1. No significant carotid plaque or stenosis.

## 2018-03-13 ENCOUNTER — Ambulatory Visit: Payer: PRIVATE HEALTH INSURANCE | Admitting: Family

## 2018-03-13 ENCOUNTER — Ambulatory Visit (INDEPENDENT_AMBULATORY_CARE_PROVIDER_SITE_OTHER): Payer: PRIVATE HEALTH INSURANCE | Admitting: Nurse Practitioner

## 2018-03-13 ENCOUNTER — Encounter: Payer: Self-pay | Admitting: Nurse Practitioner

## 2018-03-13 VITALS — BP 130/86 | HR 74 | Temp 98.0°F | Ht 70.0 in | Wt 223.0 lb

## 2018-03-13 DIAGNOSIS — J069 Acute upper respiratory infection, unspecified: Secondary | ICD-10-CM

## 2018-03-13 DIAGNOSIS — B9789 Other viral agents as the cause of diseases classified elsewhere: Secondary | ICD-10-CM

## 2018-03-13 NOTE — Progress Notes (Signed)
   Subjective:    Patient ID: Michael Herring, male    DOB: 10-04-1965, 52 y.o.   MRN: [id number]   Chief Complaint: URI (sinus congestion and drainage, sneezing, cough; symptoms began 2 days ago; taking Mucinex)   HPI Symptoms started 2 days ago, positive for rhinorrhea (clear), sore throat, general malaise and cough, afebrile, positive for chills, cough is productive (brown sputum), smokes 1 ppd, chest soreness from coughing, patient took Mucinex OTC with minimal relief, no sick contacts    Review of Systems  Constitutional: Positive for chills, diaphoresis and fatigue. Negative for fever.  HENT: Positive for congestion, rhinorrhea and sore throat. Negative for sinus pressure and sinus pain.   Eyes: Negative.   Respiratory: Positive for cough and shortness of breath.   Cardiovascular: Negative.   Gastrointestinal: Negative.   Endocrine: Negative.   Genitourinary: Negative.   Musculoskeletal: Negative.   Skin: Negative.   Allergic/Immunologic: Negative.   Neurological: Negative.   Hematological: Negative.   Psychiatric/Behavioral: Negative.        Objective:   Physical Exam  Constitutional: He is oriented to person, place, and time. He appears well-developed and well-nourished.  HENT:  Head: Normocephalic and atraumatic.  Right Ear: Tympanic membrane and external ear normal.  Left Ear: Tympanic membrane normal.  Nose: Mucosal edema present. Right sinus exhibits no maxillary sinus tenderness and no frontal sinus tenderness. Left sinus exhibits no maxillary sinus tenderness and no frontal sinus tenderness.  Mouth/Throat: Uvula is midline and mucous membranes are normal. Posterior oropharyngeal edema present.  Erythema in left ear canal  Eyes: Pupils are equal, round, and reactive to light. EOM are normal.  Neck: Normal range of motion. Neck supple.  Cardiovascular: Normal rate and regular rhythm.  Pulmonary/Chest: Effort normal and breath sounds normal.  Musculoskeletal:  Normal range of motion.  Neurological: He is alert and oriented to person, place, and time.     BP (!) 157/94   Pulse 74   Temp 98 F (36.7 C) (Oral)   Ht 5\' 10"  (1.778 m)   Wt 223 lb (101.2 kg)   BMI 32.00 kg/m       Assessment & Plan:  Michael Herring in today with chief complaint of URI (sinus congestion and drainage, sneezing, cough; symptoms began 2 days ago; taking Mucinex)   1. Viral URI with cough -Coricidin HBP OK to take for symptom management -continue Mucinex -Nyquil day/night -force fluids -ibuprofen for fever and myalgias Force fluids Throat lozenges if helps New toothbrush in 3 days  RTO if symptoms not improved  Mary-Margaret Hassell Done, FNP

## 2018-03-13 NOTE — Patient Instructions (Signed)

## 2019-08-02 ENCOUNTER — Ambulatory Visit: Payer: Self-pay | Attending: Internal Medicine

## 2019-08-02 DIAGNOSIS — Z23 Encounter for immunization: Secondary | ICD-10-CM

## 2019-08-02 NOTE — Progress Notes (Signed)
   Covid-19 Vaccination Clinic  Name:  Michael Herring    MRN: [id number] DOB: [birth year]  08/02/2019  Mr. Michael Herring was observed post Covid-19 immunization for 15 minutes without incident. He was provided with Vaccine Information Sheet and instruction to access the V-Safe system.   Mr. Michael Herring was instructed to call 911 with any severe reactions post vaccine: Marland Kitchen Difficulty breathing  . Swelling of face and throat  . A fast heartbeat  . A bad rash all over body  . Dizziness and weakness   Immunizations Administered    Name Date Dose VIS Date Route   Moderna COVID-19 Vaccine 08/02/2019 10:59 AM 0.5 mL 04/03/2019 Intramuscular   Manufacturer: Moderna   Lot: [id number]   [id number]

## 2019-08-30 ENCOUNTER — Ambulatory Visit: Payer: Self-pay | Attending: Internal Medicine

## 2019-08-30 DIAGNOSIS — Z23 Encounter for immunization: Secondary | ICD-10-CM

## 2019-08-30 NOTE — Progress Notes (Signed)
   Covid-19 Vaccination Clinic  Name:  Michael Herring    MRN: [id number] DOB: 1965-12-16  08/30/2019  [name] was observed post Covid-19 immunization for 15 minutes without incident. He was provided with Vaccine Information Sheet and instruction to access the V-Safe system.   [name] was instructed to call 911 with any severe reactions post vaccine: Marland Kitchen Difficulty breathing  . Swelling of face and throat  . A fast heartbeat  . A bad rash all over body  . Dizziness and weakness   Immunizations Administered    Name Date Dose VIS Date Route   Moderna COVID-19 Vaccine 08/30/2019 10:54 AM 0.5 mL 04/2019 Intramuscular   Manufacturer: Moderna   Lot: [id number]   [place][id number]

## 2020-10-31 DIAGNOSIS — S2249XA Multiple fractures of ribs, unspecified side, initial encounter for closed fracture: Secondary | ICD-10-CM

## 2020-10-31 HISTORY — DX: Multiple fractures of ribs, unspecified side, initial encounter for closed fracture: S22.49XA

## 2020-11-26 ENCOUNTER — Emergency Department (HOSPITAL_BASED_OUTPATIENT_CLINIC_OR_DEPARTMENT_OTHER)
Admission: EM | Admit: 2020-11-26 | Discharge: 2020-11-26 | Disposition: A | Discharge status: Home / Self Care | Payer: BC Managed Care – PPO | Attending: Emergency Medicine | Admitting: Emergency Medicine

## 2020-11-26 ENCOUNTER — Emergency Department (HOSPITAL_BASED_OUTPATIENT_CLINIC_OR_DEPARTMENT_OTHER): Payer: BC Managed Care – PPO | Admitting: Radiology

## 2020-11-26 ENCOUNTER — Other Ambulatory Visit: Payer: Self-pay

## 2020-11-26 DIAGNOSIS — I10 Essential (primary) hypertension: Secondary | ICD-10-CM | POA: Insufficient documentation

## 2020-11-26 DIAGNOSIS — Z8673 Personal history of transient ischemic attack (TIA), and cerebral infarction without residual deficits: Secondary | ICD-10-CM | POA: Diagnosis not present

## 2020-11-26 DIAGNOSIS — F17210 Nicotine dependence, cigarettes, uncomplicated: Secondary | ICD-10-CM | POA: Insufficient documentation

## 2020-11-26 DIAGNOSIS — Z79899 Other long term (current) drug therapy: Secondary | ICD-10-CM | POA: Diagnosis not present

## 2020-11-26 DIAGNOSIS — Z7982 Long term (current) use of aspirin: Secondary | ICD-10-CM | POA: Insufficient documentation

## 2020-11-26 DIAGNOSIS — R079 Chest pain, unspecified: Secondary | ICD-10-CM | POA: Diagnosis present

## 2020-11-26 DIAGNOSIS — R0789 Other chest pain: Secondary | ICD-10-CM

## 2020-11-26 LAB — CBC
HCT: 48.7 % (ref 39.0–52.0)
Hemoglobin: 16.9 g/dL (ref 13.0–17.0)
MCH: 30.8 pg (ref 26.0–34.0)
MCHC: 34.7 g/dL (ref 30.0–36.0)
MCV: 88.9 fL (ref 80.0–100.0)
Platelets: 213 10*3/uL (ref 150–400)
RBC: 5.48 MIL/uL (ref 4.22–5.81)
RDW: 13.3 % (ref 11.5–15.5)
WBC: 10.6 10*3/uL — ABNORMAL HIGH (ref 4.0–10.5)
nRBC: 0 % (ref 0.0–0.2)

## 2020-11-26 LAB — BASIC METABOLIC PANEL
Anion gap: 9 (ref 5–15)
BUN: 10 mg/dL (ref 6–20)
CO2: 24 mmol/L (ref 22–32)
Calcium: 8.6 mg/dL — ABNORMAL LOW (ref 8.9–10.3)
Chloride: 106 mmol/L (ref 98–111)
Creatinine, Ser: 0.9 mg/dL (ref 0.61–1.24)
GFR, Estimated: >60 mL/min (ref >60)
Glucose, Bld: 103 mg/dL — ABNORMAL HIGH (ref 70–99)
Potassium: 3.8 mmol/L (ref 3.5–5.1)
Sodium: 139 mmol/L (ref 135–145)

## 2020-11-26 LAB — TROPONIN I (HIGH SENSITIVITY)
Troponin I (High Sensitivity): 7 ng/L (ref <18)
Troponin I (High Sensitivity): 8 ng/L (ref <18)

## 2020-11-26 LAB — D-DIMER, QUANTITATIVE: D-Dimer, Quant: 0.5 ug/mL-FEU (ref 0.00–0.50)

## 2020-11-26 MED ORDER — ONDANSETRON HCL 4 MG/2ML IJ SOLN
4.0000 mg | Freq: Once | INTRAMUSCULAR | Status: AC
  Administered 2020-11-26: 4 mg via INTRAVENOUS
  Filled 2020-11-26: qty 2

## 2020-11-26 MED ORDER — HYDROCODONE-ACETAMINOPHEN 5-325 MG PO TABS: 1.0000 | ORAL_TABLET | Freq: Four times a day (QID) | ORAL | 0 refills | Status: DC | PRN

## 2020-11-26 MED ORDER — ONDANSETRON HCL 4 MG/2ML IJ SOLN
INTRAMUSCULAR | Status: AC
  Filled 2020-11-26: qty 2

## 2020-11-26 MED ORDER — MORPHINE SULFATE (PF) 4 MG/ML IV SOLN
4.0000 mg | Freq: Once | INTRAVENOUS | Status: AC
  Administered 2020-11-26: 4 mg via INTRAVENOUS
  Filled 2020-11-26: qty 1

## 2020-11-26 NOTE — Discharge Instructions (Addendum)
Your lab test today look normal without any sign of it being a heart attack.  Think there may be something going on with the muscles in that side of your chest.  I would avoid any heavy lifting but it is okay to stretch the area.  You could also try lidocaine patch which you can get over-the-counter.  I would continue taking the anti-inflammatory medication and the muscle relaxer.  Use the pain medicine if the pain becomes severe.

## 2020-11-26 NOTE — ED Notes (Signed)
Pt gave verbal consent for his Niece Nicanor Bake to receiving information and updates this visit.
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
## 2020-11-26 NOTE — ED Triage Notes (Signed)
Patient reports to the ER for chest pain. Patient reports he went to urgent care and was given ibuprofen. Patient reports increased pain. Patient denies ShOB but reports really sharp pain

## 2020-12-02 ENCOUNTER — Other Ambulatory Visit: Payer: Self-pay

## 2020-12-02 ENCOUNTER — Encounter: Payer: Self-pay | Admitting: Nurse Practitioner

## 2020-12-02 ENCOUNTER — Ambulatory Visit (INDEPENDENT_AMBULATORY_CARE_PROVIDER_SITE_OTHER): Payer: BC Managed Care – PPO | Admitting: Nurse Practitioner

## 2020-12-02 VITALS — BP 151/87 | HR 78 | Temp 97.1°F | Ht 70.0 in | Wt 220.6 lb

## 2020-12-02 DIAGNOSIS — R0789 Other chest pain: Secondary | ICD-10-CM | POA: Diagnosis not present

## 2020-12-02 DIAGNOSIS — I10 Essential (primary) hypertension: Secondary | ICD-10-CM

## 2020-12-02 DIAGNOSIS — Z23 Encounter for immunization: Secondary | ICD-10-CM

## 2020-12-02 MED ORDER — LISINOPRIL 20 MG PO TABS: 20.0000 mg | ORAL_TABLET | Freq: Every day | ORAL | 3 refills | Status: DC

## 2020-12-02 MED ORDER — PREDNISONE 20 MG PO TABS: 40.0000 mg | ORAL_TABLET | Freq: Every day | ORAL | 0 refills | Status: AC

## 2020-12-02 NOTE — Addendum Note (Signed)
Addended by: Thana Ates on: 12/02/2020 09:29 AM   Modules accepted: Orders

## 2020-12-02 NOTE — Progress Notes (Signed)
   Subjective:    Patient ID: Michael Herring, male    DOB: 1965-06-06, 55 y.o.   MRN: [id number]   Chief Complaint: hospital follow up  HPI Patient comes in today for ED follow up. He went to the ED on 11/26/20. He presented to the ED with severe left sided chest pain. The chest pain had actually started 4 days prior and he went to urgent care and they told him it was not his heart. He continued to have chest pain that worsened. Blood, chest xray and EKG were all negative. Was still thought to be musculoskeletal in nature,pain meds relieved pain. His blood pressure was elevated both times that went to ED. He says he still has chest pain when he sneezes of coughs.     Review of Systems  Constitutional:  Negative for diaphoresis.  Eyes:  Negative for pain.  Respiratory:  Negative for shortness of breath.   Cardiovascular:  Positive for chest pain. Negative for palpitations and leg swelling.  Gastrointestinal:  Negative for abdominal pain.  Endocrine: Negative for polydipsia.  Skin:  Negative for rash.  Neurological:  Negative for dizziness, weakness and headaches.  Hematological:  Does not bruise/bleed easily.  All other systems reviewed and are negative.     Objective:   Physical Exam Vitals and nursing note reviewed.  Constitutional:      Appearance: Normal appearance. He is obese.  Cardiovascular:     Rate and Rhythm: Normal rate and regular rhythm.     Heart sounds: Normal heart sounds.     Comments: Est wall pain on palpation left upper flank.  Patient coughed and pain shot through left upper flank. Pulmonary:     Effort: Pulmonary effort is normal.     Breath sounds: Normal breath sounds.  Skin:    General: Skin is warm.  Neurological:     General: No focal deficit present.     Mental Status: He is alert and oriented to person, place, and time.  Psychiatric:        Mood and Affect: Mood normal.        Behavior: Behavior normal.    BP (!) 151/87   Pulse 78   Temp  (!) 97.1 F (36.2 C) (Temporal)   Ht '5\' 10"'$  (1.778 m)   Wt 220 lb 9.6 oz (100.1 kg)   SpO2 95%   BMI 31.65 kg/m        Assessment & Plan:   LERRY COUNSELMAN in today with chief complaint of ER follow up  (Chest pain //)   1. Primary hypertension Keep diary of blood pressure - lisinopril (ZESTRIL) 20 MG tablet; Take 1 tablet (20 mg total) by mouth daily.  Dispense: 90 tablet; Refill: 3  2. Chest wall pain Moist heat rest - predniSONE (DELTASONE) 20 MG tablet; Take 2 tablets (40 mg total) by mouth daily with breakfast for 5 days. 2 po daily for 5 days  Dispense: 10 tablet; Refill: 0    The above assessment and management plan was discussed with the patient. The patient verbalized understanding of and has agreed to the management plan. Patient is aware to call the clinic if symptoms persist or worsen. Patient is aware when to return to the clinic for a follow-up visit. Patient educated on when it is appropriate to go to the emergency department.   Mary-Margaret Hassell Done, FNP

## 2020-12-02 NOTE — Patient Instructions (Signed)
Chest Wall Pain Chest wall pain is pain in or around the bones and muscles of your chest. Chest wall pain may be caused by: An injury. Coughing a lot. Using your chest and arm muscles too much. Sometimes, the cause may not be known. This pain may take a few weeks or longerto get better. Follow these instructions at home: Managing pain, stiffness, and swelling If told, put ice on the painful area: Put ice in a plastic bag. Place a towel between your skin and the bag. Leave the ice on for 20 minutes, 2-3 times a day.  Activity Rest as told by your doctor. Avoid doing things that cause pain. This includes lifting heavy items. Ask your doctor what activities are safe for you. General instructions  Take over-the-counter and prescription medicines only as told by your doctor. Do not use any products that contain nicotine or tobacco, such as cigarettes, e-cigarettes, and chewing tobacco. If you need help quitting, ask your doctor. Keep all follow-up visits as told by your doctor. This is important.  Contact a doctor if: You have a fever. Your chest pain gets worse. You have new symptoms. Get help right away if: You feel sick to your stomach (nauseous) or you throw up (vomit). You feel sweaty or light-headed. You have a cough with mucus from your lungs (sputum) or you cough up blood. You are short of breath. These symptoms may be an emergency. Do not wait to see if the symptoms will go away. Get medical help right away. Call your local emergency services (911 in the [place] Do not drive yourself to the hospital. Summary Chest wall pain is pain in or around the bones and muscles of your chest. It may be treated with ice, rest, and medicines. Your condition may also get better if you avoid doing things that cause pain. Contact a doctor if you have a fever, chest pain that gets worse, or new symptoms. Get help right away if you feel light-headed or you get short of breath. These symptoms may  be an emergency. This information is not intended to replace advice given to you by your health care provider. Make sure you discuss any questions you have with your healthcare provider. Document Revised: 10/20/2017 Document Reviewed: 10/20/2017 Elsevier Patient Education  2022 [employer].

## 2020-12-09 ENCOUNTER — Telehealth: Payer: Self-pay | Admitting: Nurse Practitioner

## 2020-12-09 NOTE — Telephone Encounter (Signed)
Spoke with patient and advised that work note left up front for him to pick up. Patient verbalized understanding

## 2020-12-09 NOTE — Telephone Encounter (Signed)
Ok for work note? 

## 2020-12-09 NOTE — Telephone Encounter (Signed)
Pt called to let MMM know that he is still having chest pain and is unable to go to work. Says MMM told him that if this happened, to call and let her know and she would extend his work note. Pt says he is going to try and go back to work on Monday, 12/15/20.

## 2020-12-10 ENCOUNTER — Telehealth: Payer: Self-pay | Admitting: *Deleted

## 2020-12-10 NOTE — Telephone Encounter (Signed)
Pt is very concerned with his chest pain. It was bearable while he was on prednisone, now he is about disabled due to the pain, he can not do much. His BP  is running around [id number] systolic. Should he come back in? He is just concerned

## 2020-12-10 NOTE — Telephone Encounter (Signed)
Yes needs to be seen

## 2020-12-10 NOTE — Telephone Encounter (Signed)
Pt has appt in am with MMM, pt aware

## 2020-12-11 ENCOUNTER — Encounter: Payer: Self-pay | Admitting: Nurse Practitioner

## 2020-12-11 ENCOUNTER — Ambulatory Visit (INDEPENDENT_AMBULATORY_CARE_PROVIDER_SITE_OTHER): Payer: BC Managed Care – PPO | Admitting: Nurse Practitioner

## 2020-12-11 ENCOUNTER — Other Ambulatory Visit: Payer: Self-pay

## 2020-12-11 VITALS — BP 142/96 | HR 85 | Temp 98.0°F | Resp 20 | Ht 70.0 in | Wt 217.0 lb

## 2020-12-11 DIAGNOSIS — R0789 Other chest pain: Secondary | ICD-10-CM | POA: Diagnosis not present

## 2020-12-11 DIAGNOSIS — R0782 Intercostal pain: Secondary | ICD-10-CM

## 2020-12-11 MED ORDER — PREDNISONE 10 MG (21) PO TBPK: ORAL_TABLET | ORAL | 0 refills | Status: DC

## 2020-12-11 MED ORDER — KETOROLAC TROMETHAMINE 60 MG/2ML IM SOLN
60.0000 mg | Freq: Once | INTRAMUSCULAR | Status: AC
  Administered 2020-12-11: 60 mg via INTRAMUSCULAR

## 2020-12-11 MED ORDER — CYCLOBENZAPRINE HCL 10 MG PO TABS: 10.0000 mg | ORAL_TABLET | Freq: Three times a day (TID) | ORAL | 1 refills | Status: DC | PRN

## 2020-12-11 NOTE — Addendum Note (Signed)
Addended by: Chevis Pretty on: 12/11/2020 09:34 AM   Modules accepted: Level of Service

## 2020-12-11 NOTE — Addendum Note (Signed)
Addended by: Chevis Pretty on: 12/11/2020 08:58 AM   Modules accepted: Orders

## 2020-12-11 NOTE — Patient Instructions (Signed)
Chest Wall Pain Chest wall pain is pain in or around the bones and muscles of your chest. Sometimes, an injury causes this pain. Excessive coughing or overuse of arm and chest muscles may also cause chest wall pain. Sometimes, the cause may not beknown. This pain may take several weeks or longer to get better. Follow these instructions at home: Managing pain, stiffness, and swelling  If directed, put ice on the painful area: Put ice in a plastic bag. Place a towel between your skin and the bag. Leave the ice on for 20 minutes, 2-3 times per day.  Activity Rest as told by your health care provider. Avoid activities that cause pain. These include any activities that use your chest muscles or your abdominal and side muscles to lift heavy items. Ask your health care provider what activities are safe for you. General instructions  Take over-the-counter and prescription medicines only as told by your health care provider. Do not use any products that contain nicotine or tobacco, such as cigarettes, e-cigarettes, and chewing tobacco. These can delay healing after injury. If you need help quitting, ask your health care provider. Keep all follow-up visits as told by your health care provider. This is important.  Contact a health care provider if: You have a fever. Your chest pain becomes worse. You have new symptoms. Get help right away if: You have nausea or vomiting. You feel sweaty or light-headed. You have a cough with mucus from your lungs (sputum) or you cough up blood. You develop shortness of breath. These symptoms may represent a serious problem that is an emergency. Do not wait to see if the symptoms will go away. Get medical help right away. Call your local emergency services (911 in the [place] Do not drive yourself to the hospital. Summary Chest wall pain is pain in or around the bones and muscles of your chest. Depending on the cause, it may be treated with ice, rest, medicines,  and avoiding activities that cause pain. Contact a health care provider if you have a fever, worsening chest pain, or new symptoms. Get help right away if you feel light-headed or you develop shortness of breath. These symptoms may be an emergency. This information is not intended to replace advice given to you by your health care provider. Make sure you discuss any questions you have with your healthcare provider. Document Revised: 10/20/2017 Document Reviewed: 10/20/2017 Elsevier Patient Education  2022 [employer]  
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
## 2020-12-11 NOTE — Addendum Note (Signed)
Addended by: Chevis Pretty on: 12/11/2020 09:49 AM   Modules accepted: Orders

## 2020-12-12 ENCOUNTER — Telehealth: Payer: Self-pay | Admitting: Nurse Practitioner

## 2020-12-12 NOTE — Telephone Encounter (Signed)
Patient states that he still has not heard about his MRI being scheduled. Per chart it appears that appt was made but cancelled because they thought patient did not have insurance. Advised patient to contact scheduling at [facility] and give them all insurance info so they can reschedule. Patient verbalized understanding

## 2020-12-18 ENCOUNTER — Telehealth: Payer: Self-pay | Admitting: Nurse Practitioner

## 2020-12-18 ENCOUNTER — Inpatient Hospital Stay (HOSPITAL_COMMUNITY): Admission: RE | Admit: 2020-12-18 | Payer: Self-pay | Source: Ambulatory Visit

## 2020-12-18 ENCOUNTER — Ambulatory Visit (HOSPITAL_COMMUNITY): Payer: Self-pay

## 2020-12-18 ENCOUNTER — Other Ambulatory Visit: Payer: Self-pay

## 2020-12-18 ENCOUNTER — Ambulatory Visit (HOSPITAL_COMMUNITY)
Admission: RE | Admit: 2020-12-18 | Discharge: 2020-12-18 | Disposition: A | Discharge status: Home / Self Care | Payer: BC Managed Care – PPO | Source: Ambulatory Visit | Attending: Nurse Practitioner | Admitting: Nurse Practitioner

## 2020-12-18 DIAGNOSIS — R0782 Intercostal pain: Secondary | ICD-10-CM | POA: Diagnosis present

## 2020-12-18 DIAGNOSIS — R0789 Other chest pain: Secondary | ICD-10-CM | POA: Insufficient documentation

## 2020-12-18 NOTE — Telephone Encounter (Signed)
Ok to extend work note 

## 2020-12-18 NOTE — Telephone Encounter (Signed)
Letter written and left up front for patient pick up. Patient notified and verbalized understanding 

## 2020-12-23 ENCOUNTER — Telehealth: Payer: Self-pay | Admitting: Nurse Practitioner

## 2020-12-23 NOTE — Telephone Encounter (Signed)
Letter written and left up front for patient pick up. Patient notified and verbalized understanding. Wants to know if he can have something for pain. He states that the IBU are not helping. Please advise

## 2020-12-23 NOTE — Telephone Encounter (Signed)
Ok to write note to return to work

## 2020-12-24 NOTE — Telephone Encounter (Signed)
Pain meds can only be prescribed in a face to face visit.

## 2020-12-24 NOTE — Telephone Encounter (Signed)
Patient aware and verbalizes understanding. 

## 2020-12-29 ENCOUNTER — Telehealth: Payer: Self-pay | Admitting: Nurse Practitioner

## 2020-12-29 ENCOUNTER — Encounter: Payer: Self-pay | Admitting: *Deleted

## 2020-12-29 NOTE — Telephone Encounter (Signed)
Pt called to let Michael Herring know that he is still out of work because of his chest issue and needs Michael Herring to write him out of work until Friday. Pt says he plans to go back to work on Friday if chest feels better.

## 2020-12-29 NOTE — Telephone Encounter (Signed)
Letter written and left up front for patient pick up. Patient notified and verbalized understanding 

## 2021-01-02 ENCOUNTER — Telehealth: Payer: Self-pay | Admitting: Nurse Practitioner

## 2021-01-02 NOTE — Telephone Encounter (Signed)
Okay that is fine, looks like he is waiting on an MRI

## 2021-01-02 NOTE — Telephone Encounter (Signed)
Okay for note

## 2021-01-02 NOTE — Telephone Encounter (Signed)
Work note created for pt to return to work on 9/7. He will come by the office and pick up. Letter placed up front.

## 2021-02-26 ENCOUNTER — Other Ambulatory Visit: Payer: Self-pay

## 2021-02-26 ENCOUNTER — Ambulatory Visit (INDEPENDENT_AMBULATORY_CARE_PROVIDER_SITE_OTHER): Payer: BC Managed Care – PPO | Admitting: Nurse Practitioner

## 2021-02-26 ENCOUNTER — Encounter: Payer: Self-pay | Admitting: Nurse Practitioner

## 2021-02-26 VITALS — BP 162/100 | HR 76 | Temp 98.0°F | Resp 20 | Ht 70.0 in | Wt 225.0 lb

## 2021-02-26 DIAGNOSIS — M7022 Olecranon bursitis, left elbow: Secondary | ICD-10-CM

## 2021-02-26 DIAGNOSIS — Z23 Encounter for immunization: Secondary | ICD-10-CM | POA: Diagnosis not present

## 2021-02-26 MED ORDER — LIDOCAINE HCL 2 % IJ SOLN
1.0000 mL | Freq: Once | INTRAMUSCULAR | Status: AC
Start: 2021-02-26 — End: 2021-02-26
  Administered 2021-02-26: 20 mg via INTRADERMAL

## 2021-02-26 MED ORDER — METHYLPREDNISOLONE ACETATE 40 MG/ML IJ SUSP
40.0000 mg | Freq: Once | INTRAMUSCULAR | Status: AC
Start: 2021-02-26 — End: 2021-02-26
  Administered 2021-02-26: 40 mg via INTRAMUSCULAR

## 2021-02-26 MED ORDER — SILDENAFIL CITRATE 20 MG PO TABS: 20.0000 mg | ORAL_TABLET | Freq: Three times a day (TID) | ORAL | 1 refills | Status: DC

## 2021-02-26 NOTE — Patient Instructions (Signed)
Elbow Bursitis Rehab Ask your health care provider which exercises are safe for you. Do exercises exactly as told by your health care provider and adjust them as directed. It is normal to feel mild stretching, pulling, tightness, or discomfort as you do these exercises. Stop right away if you feel sudden pain or your pain gets worse. Do not begin these exercises until told by your health care provider. Stretching and range-of-motion exercises These exercises warm up your muscles and joints and improve the movement and flexibility of your elbow. The exercises also help to relieve pain and swelling. Elbow flexion, assisted Stand or sit with your left / right arm at your side. Use your other hand to gently push your left / right hand toward your shoulder (assisted) while bending your elbow (flexion). Hold this position for __________ seconds. Slowly return your left / right arm to the starting position. Repeat __________ times. Complete this exercise __________ times a day. Elbow extension, assisted Lie on your back in a comfortable position that allows you to relax your arm muscles. Place a folded towel under your left / right upper arm so that your elbow and shoulder are at the same height. Use your other arm to raise your left / right arm (assisted) until your elbow and hand do not rest on the bed or towel. Hold your left / right arm out straight with your other hand supporting it. Let the weight of your hand straighten your elbow (extension). You should feel a stretch on the inside of your elbow. Keep your arm and chest muscles relaxed. If directed, add a small wrist weight or hand weight to increase the stretch. Hold this position for __________ seconds. Slowly release the stretch and return to the starting position. Repeat __________ times. Complete this exercise __________ times a day. Elbow flexion, active Stand or sit with your left / right elbow bent and your palm facing in, toward your  body. Bend your elbow as far as you can using only your arm muscles (active flexion). Hold this position for __________ seconds. Slowly return to the starting position. Repeat __________ times. Complete this exercise __________ times a day. Elbow extension, active Stand or sit with your left / right elbow bent and your palm facing in, toward your body. Slowly straighten your elbow using only your arm muscles (active extension). Stop when you feel a gentle stretch at the front of your arm, or when your arm is straight. Hold this position for __________ seconds. Slowly return to the starting position. Repeat __________ times. Complete this exercise __________ times a day. Strengthening exercises These exercises build strength and endurance in your elbow. Endurance is the ability to use your muscles for a long time, even after they get tired. Elbow flexion, isometric Stand or sit with your left / right arm at waist height. Your palm should face in, toward your body. Place your other hand on top of your left / right forearm. Gently push down while you resist with your left / right arm (isometric flexion). Use about 50% effort with both arms. You may be instructed to use more and more effort with your arms each week. Try not to let your left / right arm move during the exercise. Hold this position for __________ seconds. Let your muscles relax completely before you repeat the exercise. Repeat __________ times. Complete this exercise __________ times a day. Elbow extension, isometric  Stand or sit with your left / right arm at waist height. Your palm should  face in, toward your body. Place your other hand on the bottom of your left / right forearm. Gently push up while you resist with your left / right arm (isometric extension). Use about 50% effort with both arms. You may be instructed to use more and more effort with your arms each week. Try not to let your left / right arm move during the  exercise. Hold this position for __________ seconds. Let your muscles relax completely before you repeat the exercise. Repeat __________ times. Complete this exercise __________ times a day. Biceps curls Sit on a stable chair without armrests, or stand up. Hold a _________ weight in your left / right hand. Your palm should face out, away from your body, at the starting position. Bend your left / right elbow and move your hand up toward your shoulder. Keep your elbow at your side while you bend it. Slowly return to the starting position. Repeat __________ times. Complete this exercise __________ times a day. Triceps curls  Lie on your back. Hold a _________ weight in your left / right hand. Bend your left / right elbow to a 90-degree angle (right angle), so the weight is in front of your face, over your chest, and your elbow is pointed up to the ceiling. Straighten your elbow, raising your hand toward the ceiling. Use your other hand to support your left / right upper arm and to keep it still. Slowly return to the starting position. Repeat __________ times. Complete this exercise __________ times a day. This information is not intended to replace advice given to you by your health care provider. Make sure you discuss any questions you have with your health care provider. Document Revised: 08/10/2018 Document Reviewed: 05/10/2018 Elsevier Patient Education  [place].

## 2021-02-26 NOTE — Progress Notes (Signed)
   Subjective:    Patient ID: Michael Herring, male    DOB: 1966-03-11, 55 y.o.   MRN: [id number]   Chief Complaint: Swelling in left elbow (No injury/)   HPI Patient comes in c/o swelling of left elbow. Started about 5 weeks ago and continues to increase in size.    Review of Systems  Constitutional:  Negative for diaphoresis.  Eyes:  Negative for pain.  Respiratory:  Negative for shortness of breath.   Cardiovascular:  Negative for chest pain, palpitations and leg swelling.  Gastrointestinal:  Negative for abdominal pain.  Endocrine: Negative for polydipsia.  Skin:  Negative for rash.  Neurological:  Negative for dizziness, weakness and headaches.  Hematological:  Does not bruise/bleed easily.  All other systems reviewed and are negative.     Objective:   Physical Exam Musculoskeletal:     Comments: Left elbow edematous  FROM of left elbow without pain  BP (!) 162/100   Pulse 76   Temp 98 F (36.7 C) (Temporal)   Resp 20   Ht 5\' 10"  (1.778 m)   Wt 225 lb (102.1 kg)   SpO2 95%   BMI 32.28 kg/m   I & D  Date/Time: 02/26/2021 11:24 AM Performed by: Chevis Pretty, FNP Authorized by: Chevis Pretty, FNP   Consent:    Consent obtained:  Verbal   Risks discussed:  Bleeding, incomplete drainage and infection   Alternatives discussed:  No treatment Location:    Type:  Bursa   Location:  Upper extremity   Upper extremity location:  Elbow   Elbow location:  L elbow Pre-procedure details:    Skin preparation:  Povidone-iodine Sedation:    Sedation type:  None Anesthesia:    Anesthesia method:  None Procedure type:    Complexity:  Simple Procedure details:    Needle aspiration: yes     Needle size:  22 G   Incision types:  Single straight   Drainage:  Bloody   Drainage amount:  Moderate   Packing materials:  None Post-procedure details:    Procedure completion:  Tolerated well, no immediate complications         Assessment & Plan:   Michael Herring in today with chief complaint of Swelling in left elbow (No injury/)   1. Olecranon bursitis of left elbow Ice BID Rest RTO prn - methylPREDNISolone acetate (DEPO-MEDROL) injection 40 mg - lidocaine (XYLOCAINE) 2 % (with pres) injection 20 mg    The above assessment and management plan was discussed with the patient. The patient verbalized understanding of and has agreed to the management plan. Patient is aware to call the clinic if symptoms persist or worsen. Patient is aware when to return to the clinic for a follow-up visit. Patient educated on when it is appropriate to go to the emergency department.   [place], FNP '

## 2021-02-26 NOTE — Addendum Note (Signed)
Addended by: Chevis Pretty on: 02/26/2021 11:42 AM   Modules accepted: Orders

## 2021-03-12 ENCOUNTER — Other Ambulatory Visit: Payer: Self-pay

## 2021-03-12 ENCOUNTER — Ambulatory Visit (INDEPENDENT_AMBULATORY_CARE_PROVIDER_SITE_OTHER): Payer: BC Managed Care – PPO | Admitting: Nurse Practitioner

## 2021-03-12 ENCOUNTER — Encounter: Payer: Self-pay | Admitting: Nurse Practitioner

## 2021-03-12 VITALS — BP 139/90 | HR 77 | Temp 98.1°F | Resp 20 | Ht 70.0 in | Wt 223.0 lb

## 2021-03-12 DIAGNOSIS — Z23 Encounter for immunization: Secondary | ICD-10-CM

## 2021-03-12 DIAGNOSIS — R0683 Snoring: Secondary | ICD-10-CM | POA: Diagnosis not present

## 2021-03-12 DIAGNOSIS — F17200 Nicotine dependence, unspecified, uncomplicated: Secondary | ICD-10-CM

## 2021-03-12 DIAGNOSIS — N400 Enlarged prostate without lower urinary tract symptoms: Secondary | ICD-10-CM

## 2021-03-12 DIAGNOSIS — I10 Essential (primary) hypertension: Secondary | ICD-10-CM

## 2021-03-12 DIAGNOSIS — Z1211 Encounter for screening for malignant neoplasm of colon: Secondary | ICD-10-CM
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
## 2021-03-12 NOTE — Addendum Note (Signed)
Addended by: Rolena Infante on: 03/12/2021 01:53 PM   Modules accepted: Orders

## 2021-03-12 NOTE — Patient Instructions (Signed)
Managing the Challenge of Quitting Smoking ?Quitting smoking is a physical and mental challenge. You will face cravings, withdrawal symptoms, and temptation. Before quitting, work with your health care provider to make a plan that can help you manage quitting. Preparation can help you quit and keep you from giving in. ?How to manage lifestyle changes ?Managing stress ?Stress can make you want to smoke, and wanting to smoke may cause stress. It is important to find ways to manage your stress. You might try some of the following: ?Practice relaxation techniques. ?Breathe slowly and deeply, in through your nose and out through your mouth. ?Listen to music. ?Soak in a bath or take a shower. ?Imagine a peaceful place or vacation. ?Get some support. ?Talk with family or friends about your stress. ?Join a support group. ?Talk with a counselor or therapist. ?Get some physical activity. ?Go for a walk, run, or bike ride. ?Play a favorite sport. ?Practice yoga. ? ?Medicines ?Talk with your health care provider about medicines that might help you deal with cravings and make quitting easier for you. ?Relationships ?Social situations can be difficult when you are quitting smoking. To manage this, you can: ?Avoid parties and other social situations where people might be smoking. ?Avoid alcohol. ?Leave right away if you have the urge to smoke. ?Explain to your family and friends that you are quitting smoking. Ask for support and let them know you might be a bit grumpy. ?Plan activities where smoking is not an option. ?General instructions ?Be aware that many people gain weight after they quit smoking. However, not everyone does. To keep from gaining weight, have a plan in place before you quit and stick to the plan after you quit. Your plan should include: ?Having healthy snacks. When you have a craving, it may help to: ?Eat popcorn, carrots, celery, or other cut vegetables. ?Chew sugar-free gum. ?Changing how you eat. ?Eat small  portion sizes at meals. ?Eat 4-6 small meals throughout the day instead of 1-2 large meals a day. ?Be mindful when you eat. Do not watch television or do other things that might distract you as you eat. ?Exercising regularly. ?Make time to exercise each day. If you do not have time for a long workout, do short bouts of exercise for 5-10 minutes several times a day. ?Do some form of strengthening exercise, such as weight lifting. ?Do some exercise that gets your heart beating and causes you to breathe deeply, such as walking fast, running, swimming, or biking. This is very important. ?Drinking plenty of water or other low-calorie or no-calorie drinks. Drink 6-8 glasses of water daily. ? ?How to recognize withdrawal symptoms ?Your body and mind may experience discomfort as you try to get used to not having nicotine in your system. These effects are called withdrawal symptoms. They may include: ?Feeling hungrier than normal. ?Having trouble concentrating. ?Feeling irritable or restless. ?Having trouble sleeping. ?Feeling depressed. ?Craving a cigarette. ?To manage withdrawal symptoms: ?Avoid places, people, and activities that trigger your cravings. ?Remember why you want to quit. ?Get plenty of sleep. ?Avoid coffee and other caffeinated drinks. These may worsen some of your symptoms. ?These symptoms may surprise you. But be assured that they are normal to have when quitting smoking. ?How to manage cravings ?Come up with a plan for how to deal with your cravings. The plan should include the following: ?A definition of the specific situation you want to deal with. ?An alternative action you will take. ?A clear idea for how this action   will help. ?The name of someone who might help you with this. ?Cravings usually last for 5-10 minutes. Consider taking the following actions to help you with your plan to deal with cravings: ?Keep your mouth busy. ?Chew sugar-free gum. ?Suck on hard candies or a straw. ?Brush your  teeth. ?Keep your hands and body busy. ?Change to a different activity right away. ?Squeeze or play with a ball. ?Do an activity or a hobby, such as making bead jewelry, practicing needlepoint, or working with wood. ?Mix up your normal routine. ?Take a short exercise break. Go for a quick walk or run up and down stairs. ?Focus on doing something kind or helpful for someone else. ?Call a friend or family member to talk during a craving. ?Join a support group. ?Contact a quitline. ?Where to find support ?To get help or find a support group: ?Call the National Cancer Institute's Smoking Quitline: 1-800-QUIT NOW (784-8669) ?Visit the website of the Substance Abuse and Mental Health Services Administration: www.samhsa.gov ?Text QUIT to SmokefreeTXT: 478848 ?Where to find more information ?Visit these websites to find more information on quitting smoking: ?National Cancer Institute: www.smokefree.gov ?American Lung Association: www.lung.org ?American Cancer Society: www.cancer.org ?Centers for Disease Control and Prevention: www.cdc.gov ?American Heart Association: www.heart.org ?Contact a health care provider if: ?You want to change your plan for quitting. ?The medicines you are taking are not helping. ?Your eating feels out of control or you cannot sleep. ?Get help right away if: ?You feel depressed or become very anxious. ?Summary ?Quitting smoking is a physical and mental challenge. You will face cravings, withdrawal symptoms, and temptation to smoke again. Preparation can help you as you go through these challenges. ?Try different techniques to manage stress, handle social situations, and prevent weight gain. ?You can deal with cravings by keeping your mouth busy (such as by chewing gum), keeping your hands and body busy, calling family or friends, or contacting a quitline for people who want to quit smoking. ?You can deal with withdrawal symptoms by avoiding places where people smoke, getting plenty of rest, and  avoiding drinks with caffeine. ?This information is not intended to replace advice given to you by your health care provider. Make sure you discuss any questions you have with your health care provider. ?Document Revised: 12/26/2020 Document Reviewed: 02/06/2019 ?Elsevier Patient Education ? 2022 Elsevier Inc. ? ?

## 2021-07-14 ENCOUNTER — Telehealth: Payer: Self-pay | Admitting: Family Medicine

## 2021-07-16 ENCOUNTER — Encounter: Payer: Self-pay | Admitting: Nurse Practitioner

## 2021-07-16 ENCOUNTER — Ambulatory Visit (INDEPENDENT_AMBULATORY_CARE_PROVIDER_SITE_OTHER): Payer: BC Managed Care – PPO | Admitting: Nurse Practitioner

## 2021-07-16 VITALS — BP 130/95 | HR 78 | Temp 97.8°F | Resp 20 | Ht 70.0 in | Wt 219.0 lb

## 2021-07-16 DIAGNOSIS — I10 Essential (primary) hypertension: Secondary | ICD-10-CM

## 2021-07-16 MED ORDER — LISINOPRIL-HYDROCHLOROTHIAZIDE 20-12.5 MG PO TABS: 1.0000 | ORAL_TABLET | Freq: Every day | ORAL | 1 refills | Status: DC

## 2021-07-16 NOTE — Progress Notes (Signed)
? ?  Subjective:  ? ? Patient ID: Michael Herring, male    DOB: [birth year], 56 y.o.   MRN: [id number] ? ? ?Chief Complaint: BP high at work (Work won't let him come back until he is released) ? ? ?HPI ?Patient became sweaty at work Tuesday of this week and they checked his blood pressure and it was 160/105. They sent him home from work and told him he could not come back until he seen his PCP. At  home yesterday his blood pressure was 134/95. ? ?BP Readings from Last 3 Encounters:  ?07/16/21 (!) 158/97  ?03/12/21 139/90  ?02/26/21 (!) 162/100  ? ? ? ?Review of Systems  ?Constitutional:  Negative for diaphoresis.  ?Eyes:  Negative for pain.  ?Respiratory:  Negative for shortness of breath.   ?Cardiovascular:  Negative for chest pain, palpitations and leg swelling.  ?Gastrointestinal:  Negative for abdominal pain.  ?Endocrine: Negative for polydipsia.  ?Skin:  Negative for rash.  ?Neurological:  Negative for dizziness, weakness and headaches.  ?Hematological:  Does not bruise/bleed easily.  ?All other systems reviewed and are negative. ? ?   ?Objective:  ? Physical Exam ?Constitutional:   ?   Appearance: Normal appearance. He is obese.  ?Cardiovascular:  ?   Rate and Rhythm: Normal rate and regular rhythm.  ?   Pulses: Normal pulses.  ?   Heart sounds: Normal heart sounds.  ?Pulmonary:  ?   Effort: Pulmonary effort is normal.  ?   Breath sounds: Normal breath sounds.  ?Skin: ?   General: Skin is warm.  ?Neurological:  ?   General: No focal deficit present.  ?   Mental Status: He is alert and oriented to person, place, and time.  ?Psychiatric:     ?   Mood and Affect: Mood normal.     ?   Behavior: Behavior normal.  ? ?BP (!) 130/95   Pulse 78   Temp 97.8 ?F (36.6 ?C) (Temporal)   Resp 20   Ht '5\' 10"'$  (1.778 m)   Wt 219 lb (99.3 kg)   SpO2 97%   BMI 31.42 kg/m?  ? ? ? ? ?   ?Assessment & Plan:  ? ?Michael Herring in today with chief complaint of BP high at work (Work won't let him come back until he is  released) ? ? ?1. Primary hypertension ?Low sodium diet ?Change Lisinopril '20mg'$   to lisinopril 20/12.5 ?Keep diary of blood pressure at home. ? ?Meds ordered this encounter  ?Medications  ? lisinopril-hydrochlorothiazide (ZESTORETIC) 20-12.5 MG tablet  ?  Sig: Take 1 tablet by mouth daily.  ?  Dispense:  90 tablet  ?  Refill:  1  ?  Order Specific Question:   Supervising Provider  ?  Answer:   Caryl Pina A [[id number]]  ? ? ? ?The above assessment and management plan was discussed with the patient. The patient verbalized understanding of and has agreed to the management plan. Patient is aware to call the clinic if symptoms persist or worsen. Patient is aware when to return to the clinic for a follow-up visit. Patient educated on when it is appropriate to go to the emergency department.  ? ?Mary-Margaret Hassell Done, FNP ? ? ?

## 2021-07-16 NOTE — Patient Instructions (Signed)
Cooking With Less Salt Cooking with less salt is one way to reduce the amount of sodium you get from food. Sodium is one of the elements that make up salt. It is found naturally in foods and is also added to certain foods. Depending on your condition and overall health, your health care provider or dietitian may recommend that you reduce your sodium intake. Most people should have less than 2,300 milligrams (mg) of sodium each day. If you have high blood pressure (hypertension), you may need to limit your sodium to 1,500 mg each day. Follow the tipsbelow to help reduce your sodium intake. What are tips for eating less sodium? Reading food labels  Check the food label before buying or using packaged ingredients. Always check the label for the serving size and sodium content. Look for products with no more than 140 mg of sodium in one serving. Check the % Daily Value column to see what percent of the daily recommended amount of sodium is provided in one serving of the product. Foods with 5% or less in this column are considered low in sodium. Foods with 20% or higher are considered high in sodium. Do not choose foods with salt as one of the first three ingredients on the ingredients list. If salt is one of the first three ingredients, it usually means the item is high in sodium.  Shopping Buy sodium-free or low-sodium products. Look for the following words on food labels: Low-sodium. Sodium-free. Reduced-sodium. No salt added. Unsalted. Always check the sodium content even if foods are labeled as low-sodium or no salt added. Buy fresh foods. Cooking Use herbs, seasonings without salt, and spices as substitutes for salt. Use sodium-free baking soda when baking. Grill, braise, or roast foods to add flavor with less salt. Avoid adding salt to pasta, rice, or hot cereals. Drain and rinse canned vegetables, beans, and meat before use. Avoid adding salt when cooking sweets and desserts. Cook with  low-sodium ingredients. What foods are high in sodium? Vegetables Regular canned vegetables (not low-sodium or reduced-sodium). Sauerkraut, pickled vegetables, and relishes. Olives. French fries. Onion rings. Regular canned tomato sauce and paste. Regular tomato and vegetable juice. Frozenvegetables in sauces. Grains Instant hot cereals. Bread stuffing, pancake, and biscuit mixes. Croutons. Seasoned rice or pasta mixes. Noodle soup cups. Boxed or frozen macaroni and cheese. Regular salted crackers. Self-rising flour. Rolls. Bagels. Flourtortillas and wraps. Meats and other proteins Meat or fish that is salted, canned, smoked, cured, spiced, or pickled. This includes bacon, ham, sausages, hot dogs, corned beef, chipped beef, meat loaves, salt pork, jerky, pickled herring, anchovies, regular canned tuna, andsardines. Salted nuts. Dairy Processed cheese and cheese spreads. Cheese curds. Blue cheese. Feta cheese.String cheese. Regular cottage cheese. Buttermilk. Canned milk. The items listed above may not be a complete list of foods high in sodium. Actual amounts of sodium may be different depending on processing. Contact a dietitian for more information. What foods are low in sodium? Fruits Fresh, frozen, or canned fruit with no sauce added. Fruit juice. Vegetables Fresh or frozen vegetables with no sauce added. "No salt added" canned vegetables. "No salt added" tomato sauce and paste. Low-sodium orreduced-sodium tomato and vegetable juice. Grains Noodles, pasta, quinoa, rice. Shredded or puffed wheat or puffed rice. Regular or quick oats (not instant). Low-sodium crackers. Low-sodium bread. Whole-grainbread and whole-grain pasta. Unsalted popcorn. Meats and other proteins Fresh or frozen whole meats, poultry (not injected with sodium), and fish with no sauce added. Unsalted nuts. Dried peas, beans, and   lentils without added salt. Unsalted canned beans. Eggs. Unsalted nut butters. Low-sodium canned  tunaor chicken. Dairy Milk. Soy milk. Yogurt. Low-sodium cheeses, such as Swiss, Monterey Jack, mozzarella, and ricotta. Sherbet or ice cream (keep to  cup per serving).Cream cheese. Fats and oils Unsalted butter or margarine. Other foods Homemade pudding. Sodium-free baking soda and baking powder. Herbs and spices.Low-sodium seasoning mixes. Beverages Coffee and tea. Carbonated beverages. The items listed above may not be a complete list of foods low in sodium. Actual amounts of sodium may be different depending on processing. Contact a dietitian for more information. What are some salt alternatives when cooking? The following are herbs, seasonings, and spices that can be used instead of salt to flavor your food. Herbs should be fresh or dried. Do not choose packaged mixes. Next to the name of the herb, spice, or seasoning aresome examples of foods you can pair it with. Herbs [place] leaves - Soups, meat and vegetable dishes, and spaghetti sauce. Basil - Italian dishes, soups, pasta, and fish dishes. Cilantro - Meat, poultry, and vegetable dishes. Chili powder - Marinades and Mexican dishes. Chives - Salad dressings and potato dishes. Cumin - Mexican dishes, couscous, and meat dishes. Dill - Fish dishes, sauces, and salads. Fennel - Meat and vegetable dishes, breads, and cookies. Garlic (do not use garlic salt) - Italian dishes, meat dishes, salad dressings, and sauces. Marjoram - Soups, potato dishes, and meat dishes. Oregano - Pizza and spaghetti sauce. Parsley - Salads, soups, pasta, and meat dishes. Rosemary - Italian dishes, salad dressings, soups, and red meats. Saffron - Fish dishes, pasta, and some poultry dishes. Sage - Stuffings and sauces. Tarragon - Fish and poultry dishes. Thyme - Stuffing, meat, and fish dishes. Seasonings Lemon juice - Fish dishes, poultry dishes, vegetables, and salads. Vinegar - Salad dressings, vegetables, and fish dishes. Spices Cinnamon - Sweet  dishes, such as cakes, cookies, and puddings. Cloves - Gingerbread, puddings, and marinades for meats. Curry - Vegetable dishes, fish and poultry dishes, and stir-fry dishes. Ginger - Vegetable dishes, fish dishes, and stir-fry dishes. Nutmeg - Pasta, vegetables, poultry, fish dishes, and custard. Summary Cooking with less salt is one way to reduce the amount of sodium that you get from food. Buy sodium-free or low-sodium products. Check the food label before using or buying packaged ingredients. Use herbs, seasonings without salt, and spices as substitutes for salt in foods. This information is not intended to replace advice given to you by your health care provider. Make sure you discuss any questions you have with your healthcare provider. Document Revised: 04/11/2019 Document Reviewed: 04/11/2019 Elsevier Patient Education  2022 Elsevier Inc.  

## 2021-08-12 NOTE — Telephone Encounter (Signed)
Error

## 2021-09-14 ENCOUNTER — Ambulatory Visit (INDEPENDENT_AMBULATORY_CARE_PROVIDER_SITE_OTHER): Payer: BC Managed Care – PPO | Admitting: Nurse Practitioner

## 2021-09-14 ENCOUNTER — Encounter: Payer: Self-pay | Admitting: Nurse Practitioner

## 2021-09-14 VITALS — BP 122/79 | HR 89 | Temp 98.2°F | Resp 20 | Ht 70.0 in | Wt 213.0 lb

## 2021-09-14 DIAGNOSIS — I10 Essential (primary) hypertension: Secondary | ICD-10-CM | POA: Diagnosis not present

## 2021-09-14 DIAGNOSIS — Z1211 Encounter for screening for malignant neoplasm of colon: Secondary | ICD-10-CM

## 2021-09-14 DIAGNOSIS — N400 Enlarged prostate without lower urinary tract symptoms: Secondary | ICD-10-CM

## 2021-09-14 DIAGNOSIS — R0683 Snoring: Secondary | ICD-10-CM

## 2021-09-14 DIAGNOSIS — G459 Transient cerebral ischemic attack, unspecified: Secondary | ICD-10-CM

## 2021-09-14 DIAGNOSIS — F17200 Nicotine dependence, unspecified, uncomplicated: Secondary | ICD-10-CM | POA: Diagnosis not present

## 2021-09-14 MED ORDER — SILDENAFIL CITRATE 20 MG PO TABS: 20.0000 mg | ORAL_TABLET | Freq: Three times a day (TID) | ORAL | 1 refills | Status: DC
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
## 2021-12-06 ENCOUNTER — Emergency Department (HOSPITAL_BASED_OUTPATIENT_CLINIC_OR_DEPARTMENT_OTHER)
Admission: EM | Admit: 2021-12-06 | Discharge: 2021-12-07 | Disposition: A | Discharge status: Home / Self Care | Payer: BC Managed Care – PPO | Attending: Emergency Medicine | Admitting: Emergency Medicine

## 2021-12-06 ENCOUNTER — Encounter (HOSPITAL_BASED_OUTPATIENT_CLINIC_OR_DEPARTMENT_OTHER): Payer: Self-pay

## 2021-12-06 ENCOUNTER — Other Ambulatory Visit: Payer: Self-pay

## 2021-12-06 DIAGNOSIS — Z7982 Long term (current) use of aspirin: Secondary | ICD-10-CM | POA: Diagnosis not present

## 2021-12-06 DIAGNOSIS — R1032 Left lower quadrant pain: Secondary | ICD-10-CM | POA: Diagnosis present

## 2021-12-06 DIAGNOSIS — Z79899 Other long term (current) drug therapy: Secondary | ICD-10-CM | POA: Insufficient documentation

## 2021-12-06 DIAGNOSIS — D72829 Elevated white blood cell count, unspecified: Secondary | ICD-10-CM | POA: Insufficient documentation

## 2021-12-06 DIAGNOSIS — I10 Essential (primary) hypertension: Secondary | ICD-10-CM | POA: Insufficient documentation

## 2021-12-06 LAB — LIPASE, BLOOD: Lipase: 21 U/L (ref 11–51)

## 2021-12-06 LAB — URINALYSIS, ROUTINE W REFLEX MICROSCOPIC
Bilirubin Urine: NEGATIVE
Glucose, UA: NEGATIVE mg/dL
Ketones, ur: NEGATIVE mg/dL
Leukocytes,Ua: NEGATIVE
Nitrite: NEGATIVE
Protein, ur: 30 mg/dL — AB
Specific Gravity, Urine: 1.027 (ref 1.005–1.030)
pH: 6 (ref 5.0–8.0)

## 2021-12-06 LAB — COMPREHENSIVE METABOLIC PANEL
ALT: 21 U/L (ref 0–44)
AST: 15 U/L (ref 15–41)
Albumin: 4.6 g/dL (ref 3.5–5.0)
Alkaline Phosphatase: 59 U/L (ref 38–126)
Anion gap: 8 (ref 5–15)
BUN: 12 mg/dL (ref 6–20)
CO2: 28 mmol/L (ref 22–32)
Calcium: 9.2 mg/dL (ref 8.9–10.3)
Chloride: 102 mmol/L (ref 98–111)
Creatinine, Ser: 1.17 mg/dL (ref 0.61–1.24)
GFR, Estimated: >60 mL/min (ref >60)
Glucose, Bld: 101 mg/dL — ABNORMAL HIGH (ref 70–99)
Potassium: 3.8 mmol/L (ref 3.5–5.1)
Sodium: 138 mmol/L (ref 135–145)
Total Bilirubin: 0.6 mg/dL (ref 0.3–1.2)
Total Protein: 7.3 g/dL (ref 6.5–8.1)

## 2021-12-06 LAB — CBC
HCT: 50.7 % (ref 39.0–52.0)
Hemoglobin: 17.9 g/dL — ABNORMAL HIGH (ref 13.0–17.0)
MCH: 31.2 pg (ref 26.0–34.0)
MCHC: 35.3 g/dL (ref 30.0–36.0)
MCV: 88.5 fL (ref 80.0–100.0)
Platelets: 223 10*3/uL (ref 150–400)
RBC: 5.73 MIL/uL (ref 4.22–5.81)
RDW: 12.8 % (ref 11.5–15.5)
WBC: 12.5 10*3/uL — ABNORMAL HIGH (ref 4.0–10.5)
nRBC: 0 % (ref 0.0–0.2)

## 2021-12-06 NOTE — ED Triage Notes (Signed)
POV, pt c.o left side abd pain that started on Friday, decreased appetite, denies NVD. Pt alert and oriented x 4, ambulatory to triage. Pt sts that he has lost 13 lbs since Friday due to not eating/being hungry. Normal bowel movements, LBM today

## 2021-12-07 ENCOUNTER — Emergency Department (HOSPITAL_BASED_OUTPATIENT_CLINIC_OR_DEPARTMENT_OTHER): Payer: BC Managed Care – PPO

## 2021-12-07 MED ORDER — HYOSCYAMINE SULFATE SL 0.125 MG SL SUBL: 1.0000 | SUBLINGUAL_TABLET | Freq: Four times a day (QID) | SUBLINGUAL | 0 refills | Status: DC | PRN

## 2021-12-07 MED ORDER — ALUM & MAG HYDROXIDE-SIMETH 200-200-20 MG/5ML PO SUSP
30.0000 mL | Freq: Once | ORAL | Status: AC
  Administered 2021-12-07: 30 mL via ORAL
  Filled 2021-12-07: qty 30

## 2021-12-07 MED ORDER — IOHEXOL 300 MG/ML  SOLN
100.0000 mL | Freq: Once | INTRAMUSCULAR | Status: AC | PRN
  Administered 2021-12-07: 100 mL via INTRAVENOUS

## 2021-12-07 MED ORDER — SODIUM CHLORIDE 0.9 % IV BOLUS
1000.0000 mL | Freq: Once | INTRAVENOUS | Status: AC
  Administered 2021-12-07: 1000 mL via INTRAVENOUS

## 2021-12-07 MED ORDER — OMEPRAZOLE 20 MG PO CPDR: 20.0000 mg | DELAYED_RELEASE_CAPSULE | Freq: Every day | ORAL | 0 refills | Status: DC

## 2021-12-07 MED ORDER — HYDROMORPHONE HCL 1 MG/ML IJ SOLN
1.0000 mg | Freq: Once | INTRAMUSCULAR | Status: AC
  Administered 2021-12-07: 1 mg via INTRAVENOUS
  Filled 2021-12-07: qty 1

## 2021-12-07 MED ORDER — ONDANSETRON 4 MG PO TBDP: 4.0000 mg | ORAL_TABLET | Freq: Three times a day (TID) | ORAL | 0 refills | Status: AC | PRN

## 2021-12-07 MED ORDER — HYOSCYAMINE SULFATE 0.125 MG SL SUBL
0.2500 mg | SUBLINGUAL_TABLET | Freq: Once | SUBLINGUAL | Status: AC
  Administered 2021-12-07: 0.25 mg via SUBLINGUAL
  Filled 2021-12-07: qty 2

## 2021-12-07 NOTE — ED Notes (Signed)
Patient transported to CT via w/c at this time; no acute changes/distress noted
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
## 2021-12-09 ENCOUNTER — Ambulatory Visit (INDEPENDENT_AMBULATORY_CARE_PROVIDER_SITE_OTHER): Payer: BC Managed Care – PPO | Admitting: Family Medicine

## 2021-12-09 ENCOUNTER — Encounter: Payer: Self-pay | Admitting: Family Medicine

## 2021-12-09 VITALS — BP 114/77 | HR 96 | Temp 98.0°F | Ht 70.0 in | Wt 210.0 lb

## 2021-12-09 DIAGNOSIS — K529 Noninfective gastroenteritis and colitis, unspecified: Secondary | ICD-10-CM | POA: Diagnosis not present

## 2021-12-09 DIAGNOSIS — K5792 Diverticulitis of intestine, part unspecified, without perforation or abscess without bleeding: Secondary | ICD-10-CM

## 2021-12-09 DIAGNOSIS — R1032 Left lower quadrant pain: Secondary | ICD-10-CM | POA: Diagnosis not present

## 2021-12-09 MED ORDER — METRONIDAZOLE 500 MG PO TABS: 500.0000 mg | ORAL_TABLET | Freq: Two times a day (BID) | ORAL | 0 refills | Status: DC

## 2021-12-09 MED ORDER — CIPROFLOXACIN HCL 500 MG PO TABS: 500.0000 mg | ORAL_TABLET | Freq: Two times a day (BID) | ORAL | 0 refills | Status: DC

## 2021-12-09 NOTE — Progress Notes (Signed)
BP 114/77   Pulse 96   Temp 98 F (36.7 C)   Ht $R'5\' 10"'OY$  (1.778 m)   Wt 210 lb (95.3 kg)   SpO2 96%   BMI 30.13 kg/m    Subjective:   Patient ID: Michael Herring, male    DOB: 1966/01/12, 56 y.o.   MRN: [id number]  HPI: Michael Herring is a 56 y.o. male presenting on 12/09/2021 for Abdominal Pain and unable to eat   HPI Abdominal pain Patient complains of left lower quadrant abdominal pain that hurts after eating.  He says it has been going on for the past 5 days and was seen in the ER for it.  He did have a CT scan that came back essentially normal.  He says he still when he eats he gets abdominal pain almost immediately and it has made him so that he does not want to eat.  He thinks he is lost about 17 pounds over the past 5 days because of it.  He does have nausea but denies any vomiting.  He is having bowel movements 2 or 3 times a day which is essentially normal for him.  He denies any diarrhea or blood in his stool.  He was given omeprazole Zofran and hyoscyamine in the emergency department and feels like they have helped a little but it is just not fully improving.  Relevant past medical, surgical, family and social history reviewed and updated as indicated. Interim medical history since our last visit reviewed. Allergies and medications reviewed and updated.  Review of Systems  Constitutional:  Negative for chills and fever.  Respiratory:  Negative for shortness of breath and wheezing.   Cardiovascular:  Negative for chest pain and leg swelling.  Gastrointestinal:  Positive for abdominal pain and nausea. Negative for abdominal distention, anal bleeding, blood in stool, constipation, diarrhea and vomiting.  Musculoskeletal:  Negative for back pain and gait problem.  Skin:  Negative for rash.  All other systems reviewed and are negative.   Per HPI unless specifically indicated above   Allergies as of 12/09/2021   No Known Allergies      Medication List         Accurate as of December 09, 2021 11:20 AM. If you have any questions, ask your nurse or doctor.          aspirin EC 81 MG tablet Take 1 tablet (81 mg total) by mouth daily.   ciprofloxacin 500 MG tablet Commonly known as: Cipro Take 1 tablet (500 mg total) by mouth 2 (two) times daily. Started by: Fransisca Kaufmann Tanishi Nault, MD   Hyoscyamine Sulfate SL 0.125 MG Subl Commonly known as: Levsin/SL Place 1 each under the tongue 4 (four) times daily as needed for up to 5 days.   lisinopril-hydrochlorothiazide 20-12.5 MG tablet Commonly known as: Zestoretic Take 1 tablet by mouth daily.   metroNIDAZOLE 500 MG tablet Commonly known as: FLAGYL Take 1 tablet (500 mg total) by mouth 2 (two) times daily. Started by: Worthy Rancher, MD   omeprazole 20 MG capsule Commonly known as: PRILOSEC Take 1 capsule (20 mg total) by mouth daily.   ondansetron 4 MG disintegrating tablet Commonly known as: ZOFRAN-ODT Take 1 tablet (4 mg total) by mouth every 8 (eight) hours as needed for up to 3 days for nausea or vomiting.   sildenafil 20 MG tablet Commonly known as: REVATIO Take 1 tablet (20 mg total) by mouth 3 (three) times daily.  Objective:   BP 114/77   Pulse 96   Temp 98 F (36.7 C)   Ht $R'5\' 10"'CL$  (1.778 m)   Wt 210 lb (95.3 kg)   SpO2 96%   BMI 30.13 kg/m   Wt Readings from Last 3 Encounters:  12/09/21 210 lb (95.3 kg)  12/06/21 206 lb (93.4 kg)  09/14/21 213 lb (96.6 kg)    Physical Exam Vitals and nursing note reviewed.  Constitutional:      General: He is not in acute distress.    Appearance: He is well-developed. He is not diaphoretic.  Eyes:     General: No scleral icterus.    Conjunctiva/sclera: Conjunctivae normal.  Neck:     Thyroid: No thyromegaly.  Abdominal:     Tenderness: There is abdominal tenderness in the left lower quadrant. There is no right CVA tenderness, left CVA tenderness or rebound. Negative signs include Murphy's sign and McBurney's sign.   Skin:    General: Skin is warm and dry.     Findings: No rash.  Neurological:     Mental Status: He is alert and oriented to person, place, and time.     Coordination: Coordination normal.  Psychiatric:        Behavior: Behavior normal.       Assessment & Plan:   Problem List Items Addressed This Visit   None Visit Diagnoses     Abdominal pain, LLQ    -  Primary   Relevant Medications   ciprofloxacin (CIPRO) 500 MG tablet   metroNIDAZOLE (FLAGYL) 500 MG tablet   Other Relevant Orders   Ambulatory referral to Gastroenterology   CBC with Differential/Platelet   CMP14+EGFR   Lipase   Colitis       Relevant Medications   ciprofloxacin (CIPRO) 500 MG tablet   metroNIDAZOLE (FLAGYL) 500 MG tablet   Other Relevant Orders   Ambulatory referral to Gastroenterology   CBC with Differential/Platelet   CMP14+EGFR   Lipase   Diverticulitis       Relevant Medications   ciprofloxacin (CIPRO) 500 MG tablet   metroNIDAZOLE (FLAGYL) 500 MG tablet   Other Relevant Orders   Ambulatory referral to Gastroenterology   CBC with Differential/Platelet   CMP14+EGFR   Lipase       Will treat like possible diverticulitis and refer to gastroenterology Follow up plan: Return if symptoms worsen or fail to improve.  Counseling provided for all of the vaccine components Orders Placed This Encounter  Procedures   CBC with Differential/Platelet   CMP14+EGFR   Lipase   Ambulatory referral to Gastroenterology    Caryl Pina, MD [facility] 12/09/2021, 11:20 AM

## 2021-12-10 LAB — CBC WITH DIFFERENTIAL/PLATELET
Basophils Absolute: 0.1 10*3/uL (ref 0.0–0.2)
Basos: 1 %
EOS (ABSOLUTE): 0.2 10*3/uL (ref 0.0–0.4)
Eos: 2 %
Hematocrit: 51.1 % — ABNORMAL HIGH (ref 37.5–51.0)
Hemoglobin: 17.8 g/dL — ABNORMAL HIGH (ref 13.0–17.7)
Immature Grans (Abs): 0 10*3/uL (ref 0.0–0.1)
Immature Granulocytes: 0 %
Lymphocytes Absolute: 1.7 10*3/uL (ref 0.7–3.1)
Lymphs: 17 %
MCH: 31.1 pg (ref 26.6–33.0)
MCHC: 34.8 g/dL (ref 31.5–35.7)
MCV: 89 fL (ref 79–97)
Monocytes Absolute: 0.8 10*3/uL (ref 0.1–0.9)
Monocytes: 8 %
Neutrophils Absolute: 7.3 10*3/uL — ABNORMAL HIGH (ref 1.4–7.0)
Neutrophils: 72 %
Platelets: 242 10*3/uL (ref 150–450)
RBC: 5.73 x10E6/uL (ref 4.14–5.80)
RDW: 13.1 % (ref 11.6–15.4)
WBC: 10 10*3/uL (ref 3.4–10.8)

## 2021-12-10 LAB — CMP14+EGFR
ALT: 21 IU/L (ref 0–44)
AST: 12 IU/L (ref 0–40)
Albumin/Globulin Ratio: 2.2 (ref 1.2–2.2)
Albumin: 4.6 g/dL (ref 3.8–4.9)
Alkaline Phosphatase: 75 IU/L (ref 44–121)
BUN/Creatinine Ratio: 12 (ref 9–20)
BUN: 15 mg/dL (ref 6–24)
Bilirubin Total: 0.7 mg/dL (ref 0.0–1.2)
CO2: 24 mmol/L (ref 20–29)
Calcium: 9.7 mg/dL (ref 8.7–10.2)
Chloride: 97 mmol/L (ref 96–106)
Creatinine, Ser: 1.23 mg/dL (ref 0.76–1.27)
Globulin, Total: 2.1 g/dL (ref 1.5–4.5)
Glucose: 105 mg/dL — ABNORMAL HIGH (ref 70–99)
Potassium: 4.1 mmol/L (ref 3.5–5.2)
Sodium: 136 mmol/L (ref 134–144)
Total Protein: 6.7 g/dL (ref 6.0–8.5)
eGFR: 69 mL/min/{1.73_m2} (ref >59)

## 2021-12-10 LAB — LIPASE: Lipase: 24 U/L (ref 13–78)

## 2021-12-14 ENCOUNTER — Encounter (INDEPENDENT_AMBULATORY_CARE_PROVIDER_SITE_OTHER): Payer: Self-pay | Admitting: Gastroenterology

## 2021-12-14 ENCOUNTER — Ambulatory Visit (INDEPENDENT_AMBULATORY_CARE_PROVIDER_SITE_OTHER): Payer: BC Managed Care – PPO | Admitting: Gastroenterology

## 2021-12-14 DIAGNOSIS — R634 Abnormal weight loss: Secondary | ICD-10-CM | POA: Diagnosis not present

## 2021-12-14 DIAGNOSIS — R1012 Left upper quadrant pain: Secondary | ICD-10-CM | POA: Insufficient documentation
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
## 2021-12-14 NOTE — Patient Instructions (Signed)
Schedule EGD and colonoscopy Increase omeprazole to 40 mg qday (2 pills every day) Stop ciprofloxacin and metronidazole

## 2021-12-17 ENCOUNTER — Encounter: Payer: Self-pay | Admitting: Nurse Practitioner

## 2021-12-17 ENCOUNTER — Telehealth: Payer: Self-pay | Admitting: Nurse Practitioner

## 2021-12-17 ENCOUNTER — Ambulatory Visit (INDEPENDENT_AMBULATORY_CARE_PROVIDER_SITE_OTHER): Payer: BC Managed Care – PPO | Admitting: Nurse Practitioner

## 2021-12-17 VITALS — BP 129/80 | HR 81 | Temp 97.8°F | Resp 20 | Ht 70.0 in | Wt 210.0 lb

## 2021-12-17 DIAGNOSIS — K529 Noninfective gastroenteritis and colitis, unspecified: Secondary | ICD-10-CM | POA: Diagnosis not present

## 2021-12-17 DIAGNOSIS — Z09 Encounter for follow-up examination after completed treatment for conditions other than malignant neoplasm: Secondary | ICD-10-CM | POA: Diagnosis not present

## 2021-12-17 NOTE — Patient Instructions (Signed)

## 2021-12-17 NOTE — Progress Notes (Signed)
Subjective:    Patient ID: Michael Herring, male    DOB: [birth year], 56 y.o.   MRN: [id number]   Chief Complaint: ER Follow up   HPI Patient went to the ED on 12/06/21. He was having stomach issues. Was having abdominal pain, nausea, not able to eat. Weight loss. ED gave him zofran, levsin and omperazole. He saw GI. They increased omeprazole to '40mg'$  daily and he is scheduled for EGD and colonoscopy. He is much better today. Was able to eat normal yesterday. Has appetite back.    Review of Systems  Constitutional:  Negative for diaphoresis.  Eyes:  Negative for pain.  Respiratory:  Negative for shortness of breath.   Cardiovascular:  Negative for chest pain, palpitations and leg swelling.  Gastrointestinal:  Negative for abdominal pain, nausea and vomiting.  Endocrine: Negative for polydipsia.  Skin:  Negative for rash.  Neurological:  Negative for dizziness, weakness and headaches.  Hematological:  Does not bruise/bleed easily.  All other systems reviewed and are negative.      Objective:   Physical Exam Vitals and nursing note reviewed.  Constitutional:      Appearance: Normal appearance. He is well-developed.  Neck:     Thyroid: No thyroid mass or thyromegaly.     Vascular: No carotid bruit or JVD.     Trachea: Phonation normal.  Cardiovascular:     Rate and Rhythm: Normal rate and regular rhythm.  Pulmonary:     Effort: Pulmonary effort is normal. No respiratory distress.     Breath sounds: Normal breath sounds.  Abdominal:     General: Bowel sounds are normal.     Palpations: Abdomen is soft.     Tenderness: There is no abdominal tenderness.  Musculoskeletal:        General: Normal range of motion.     Cervical back: Normal range of motion and neck supple.  Lymphadenopathy:     Cervical: No cervical adenopathy.  Skin:    General: Skin is warm and dry.  Neurological:     Mental Status: He is alert and oriented to person, place, and time.  Psychiatric:         Behavior: Behavior normal.        Thought Content: Thought content normal.        Judgment: Judgment normal.    BP 129/80   Pulse 81   Temp 97.8 F (36.6 C) (Oral)   Resp 20   Ht '5\' 10"'$  (1.778 m)   Wt 210 lb (95.3 kg)   SpO2 97%   BMI 30.13 kg/m         Assessment & Plan:   TAVARI LOADHOLT in today with chief complaint of ER Follow up   1. Colitis First 24 Hours-Clear liquids  popsicles  Jello  gatorade  Sprite Second 24 hours-Add Full liquids ( Liquids you cant see through) Third 24 hours- Bland diet ( foods that are baked or broiled)  *avoiding fried foods and highly spiced foods* During these 3 days  Avoid milk, cheese, ice cream or any other dairy products  Avoid caffeine- REMEMBER Mt. Dew and Mello Yellow contain lots of caffeine You should eat and drink in  Frequent small volumes If no improvement in symptoms or worsen in 2-3 days should RETRUN TO OFFICE or go to ER!     2. Hospital discharge follow-up Ed records reviewed    The above assessment and management plan was discussed with the patient. The patient verbalized  understanding of and has agreed to the management plan. Patient is aware to call the clinic if symptoms persist or worsen. Patient is aware when to return to the clinic for a follow-up visit. Patient educated on when it is appropriate to go to the emergency department.   Galaxy-Margaret Hassell Done, FNP

## 2021-12-18 NOTE — Telephone Encounter (Signed)
Paperwork on providers desk

## 2021-12-22 ENCOUNTER — Telehealth: Payer: Self-pay | Admitting: Nurse Practitioner

## 2021-12-23 ENCOUNTER — Other Ambulatory Visit (INDEPENDENT_AMBULATORY_CARE_PROVIDER_SITE_OTHER): Payer: Self-pay

## 2021-12-23 ENCOUNTER — Encounter (INDEPENDENT_AMBULATORY_CARE_PROVIDER_SITE_OTHER): Payer: Self-pay

## 2021-12-23 ENCOUNTER — Telehealth (INDEPENDENT_AMBULATORY_CARE_PROVIDER_SITE_OTHER): Payer: Self-pay

## 2021-12-23 DIAGNOSIS — I10 Essential (primary) hypertension: Secondary | ICD-10-CM

## 2021-12-23 MED ORDER — PEG 3350-KCL-NA BICARB-NACL 420 G PO SOLR: 4000.0000 mL | ORAL | 0 refills | Status: DC

## 2021-12-23 NOTE — Telephone Encounter (Signed)
Pt aware forms faxed yesterday afternoon

## 2021-12-23 NOTE — Telephone Encounter (Signed)
Noelie Renfrow Ann Atleigh Gruen, CMA  ?

## 2021-12-23 NOTE — Telephone Encounter (Signed)
Duplicate encounter, pt aware

## 2021-12-24 ENCOUNTER — Encounter (INDEPENDENT_AMBULATORY_CARE_PROVIDER_SITE_OTHER): Payer: Self-pay

## 2021-12-25 ENCOUNTER — Telehealth: Payer: Self-pay | Admitting: Nurse Practitioner

## 2021-12-25 NOTE — Telephone Encounter (Signed)
Copy given to MMM

## 2021-12-25 NOTE — Telephone Encounter (Signed)
Papers received and changed per patients request. Sent back up front for forms to be refaxed. Patient notified and verbalized understanding

## 2021-12-25 NOTE — Telephone Encounter (Signed)
Patient had FMLA paperwork completed and needed to add that he may be out for "flare ups." Please call back.

## 2021-12-25 NOTE — Telephone Encounter (Signed)
We will have to fix that- can he bring papers back in to correct

## 2021-12-25 NOTE — Telephone Encounter (Signed)
Tye Maryland should have copy in her drawer Leafy Ro can you get please? I'm working remote today.

## 2021-12-28 NOTE — Telephone Encounter (Signed)
NA/NVM updated FMLA ppw faxed

## 2022-01-07 NOTE — Patient Instructions (Signed)
WIRT HEMMERICH  01/07/2022     '@PREFPERIOPPHARMACY'$ @   Your procedure is scheduled on  01/14/2022.   Report to [facility] at  0700  A.M.   Call this number if you have problems the morning of surgery:  [phone]   Remember:  Do not eat or drink after midnight.      Take these medicines the morning of surgery with A SIP OF WATER                    xanax(if needed), protonix.     Do not wear jewelry, make-up or nail polish.  Do not wear lotions, powders, or perfumes, or deodorant.  Do not shave 48 hours prior to surgery.  Men may shave face and neck.  Do not bring valuables to the hospital.  [facility] is not responsible for any belongings or valuables.  Contacts, dentures or bridgework may not be worn into surgery.  Leave your suitcase in the car.  After surgery it may be brought to your room.  For patients admitted to the hospital, discharge time will be determined by your treatment team.  Patients discharged the day of surgery will not be allowed to drive home and must have someone with them for 24 hours.   Special instructions:   DO NOT smoke tobacco or vape for 24 hours before your procedure.  Please read over the following fact sheets that you were given. Anesthesia Post-op Instructions and Care and Recovery After Surgery      Upper Endoscopy, Adult, Care After After the procedure, it is common to have a sore throat. It is also common to have: Mild stomach pain or discomfort. Bloating. Nausea. Follow these instructions at home: The instructions below may help you care for yourself at home. Your health care provider may give you more instructions. If you have questions, ask your health care provider. If you were given a sedative during the procedure, it can affect you for several hours. Do not drive or operate machinery until your health care provider says that it is safe. If you will be going home right after the procedure, plan to have a  responsible adult: Take you home from the hospital or clinic. You will not be allowed to drive. Care for you for the time you are told. Follow instructions from your health care provider about what you may eat and drink. Return to your normal activities as told by your health care provider. Ask your health care provider what activities are safe for you. Take over-the-counter and prescription medicines only as told by your health care provider. Contact a health care provider if you: Have a sore throat that lasts longer than one day. Have trouble swallowing. Have a fever. Get help right away if you: Vomit blood or your vomit looks like coffee grounds. Have bloody, black, or tarry stools. Have a very bad sore throat or you cannot swallow. Have difficulty breathing or very bad pain in your chest or abdomen. These symptoms may be an emergency. Get help right away. Call 911. Do not wait to see if the symptoms will go away. Do not drive yourself to the hospital. Summary After the procedure, it is common to have a sore throat, mild stomach discomfort, bloating, and nausea. If you were given a sedative during the procedure, it can affect you for several hours. Do not drive until your health care provider says that it is safe.  Follow instructions from your health care provider about what you may eat and drink. Return to your normal activities as told by your health care provider. This information is not intended to replace advice given to you by your health care provider. Make sure you discuss any questions you have with your health care provider. Document Revised: 07/29/2021 Document Reviewed: 07/29/2021 Elsevier Patient Education  [place]. Colonoscopy, Adult, Care After The following information offers guidance on how to care for yourself after your procedure. Your health care provider may also give you more specific instructions. If you have problems or questions, contact your health  care provider. What can I expect after the procedure? After the procedure, it is common to have: A small amount of blood in your stool for 24 hours after the procedure. Some gas. Mild cramping or bloating of your abdomen. Follow these instructions at home: Eating and drinking  Drink enough fluid to keep your urine pale yellow. Follow instructions from your health care provider about eating or drinking restrictions. Resume your normal diet as told by your health care provider. Avoid heavy or fried foods that are hard to digest. Activity Rest as told by your health care provider. Avoid sitting for a long time without moving. Get up to take short walks every 1-2 hours. This is important to improve blood flow and breathing. Ask for help if you feel weak or unsteady. Return to your normal activities as told by your health care provider. Ask your health care provider what activities are safe for you. Managing cramping and bloating  Try walking around when you have cramps or feel bloated. If directed, apply heat to your abdomen as told by your health care provider. Use the heat source that your health care provider recommends, such as a moist heat pack or a heating pad. Place a towel between your skin and the heat source. Leave the heat on for 20-30 minutes. Remove the heat if your skin turns bright red. This is especially important if you are unable to feel pain, heat, or cold. You have a greater risk of getting burned. General instructions If you were given a sedative during the procedure, it can affect you for several hours. Do not drive or operate machinery until your health care provider says that it is safe. For the first 24 hours after the procedure: Do not sign important documents. Do not drink alcohol. Do your regular daily activities at a slower pace than normal. Eat soft foods that are easy to digest. Take over-the-counter and prescription medicines only as told by your health care  provider. Keep all follow-up visits. This is important. Contact a health care provider if: You have blood in your stool 2-3 days after the procedure. Get help right away if: You have more than a small spotting of blood in your stool. You have large blood clots in your stool. You have swelling of your abdomen. You have nausea or vomiting. You have a fever. You have increasing pain in your abdomen that is not relieved with medicine. These symptoms may be an emergency. Get help right away. Call 911. Do not wait to see if the symptoms will go away. Do not drive yourself to the hospital. Summary After the procedure, it is common to have a small amount of blood in your stool. You may also have mild cramping and bloating of your abdomen. If you were given a sedative during the procedure, it can affect you for several hours. Do not drive  or operate machinery until your health care provider says that it is safe. Get help right away if you have a lot of blood in your stool, nausea or vomiting, a fever, or increased pain in your abdomen. This information is not intended to replace advice given to you by your health care provider. Make sure you discuss any questions you have with your health care provider. Document Revised: 12/10/2020 Document Reviewed: 12/10/2020 Elsevier Patient Education  [place] After This sheet gives you information about how to care for yourself after your procedure. Your health care provider may also give you more specific instructions. If you have problems or questions, contact your health care provider. What can I expect after the procedure? After the procedure, it is common to have: Tiredness. Forgetfulness about what happened after the procedure. Impaired judgment for important decisions. Nausea or vomiting. Some difficulty with balance. Follow these instructions at home: For the time period you were told by your health care  provider:     Rest as needed. Do not participate in activities where you could fall or become injured. Do not drive or use machinery. Do not drink alcohol. Do not take sleeping pills or medicines that cause drowsiness. Do not make important decisions or sign legal documents. Do not take care of children on your own. Eating and drinking Follow the diet that is recommended by your health care provider. Drink enough fluid to keep your urine pale yellow. If you vomit: Drink water, juice, or soup when you can drink without vomiting. Make sure you have little or no nausea before eating solid foods. General instructions Have a responsible adult stay with you for the time you are told. It is important to have someone help care for you until you are awake and alert. Take over-the-counter and prescription medicines only as told by your health care provider. If you have sleep apnea, surgery and certain medicines can increase your risk for breathing problems. Follow instructions from your health care provider about wearing your sleep device: Anytime you are sleeping, including during daytime naps. While taking prescription pain medicines, sleeping medicines, or medicines that make you drowsy. Avoid smoking. Keep all follow-up visits as told by your health care provider. This is important. Contact a health care provider if: You keep feeling nauseous or you keep vomiting. You feel light-headed. You are still sleepy or having trouble with balance after 24 hours. You develop a rash. You have a fever. You have redness or swelling around the IV site. Get help right away if: You have trouble breathing. You have new-onset confusion at home. Summary For several hours after your procedure, you may feel tired. You may also be forgetful and have poor judgment. Have a responsible adult stay with you for the time you are told. It is important to have someone help care for you until you are awake and  alert. Rest as told. Do not drive or operate machinery. Do not drink alcohol or take sleeping pills. Get help right away if you have trouble breathing, or if you suddenly become confused. This information is not intended to replace advice given to you by your health care provider. Make sure you discuss any questions you have with your health care provider. Document Revised: 03/24/2021 Document Reviewed: 03/22/2019 Elsevier Patient Education  [place].

## 2022-01-08 ENCOUNTER — Other Ambulatory Visit: Payer: Self-pay

## 2022-01-08 ENCOUNTER — Encounter (HOSPITAL_COMMUNITY): Payer: Self-pay

## 2022-01-08 ENCOUNTER — Encounter (HOSPITAL_COMMUNITY)
Admission: RE | Admit: 2022-01-08 | Discharge: 2022-01-08 | Disposition: A | Discharge status: Home / Self Care | Payer: BC Managed Care – PPO | Source: Ambulatory Visit | Attending: Gastroenterology | Admitting: Gastroenterology

## 2022-01-08 VITALS — BP 146/93 | HR 88 | Temp 97.8°F | Resp 18 | Ht 70.0 in | Wt 210.1 lb

## 2022-01-08 DIAGNOSIS — Z01810 Encounter for preprocedural cardiovascular examination: Secondary | ICD-10-CM | POA: Diagnosis present

## 2022-01-08 DIAGNOSIS — I10 Essential (primary) hypertension: Secondary | ICD-10-CM | POA: Diagnosis not present

## 2022-01-08 HISTORY — DX: Essential (primary) hypertension: I10

## 2022-01-08 HISTORY — DX: Cerebral infarction, unspecified: I63.9

## 2022-01-14 ENCOUNTER — Ambulatory Visit (HOSPITAL_COMMUNITY): Payer: BC Managed Care – PPO | Admitting: Anesthesiology

## 2022-01-14 ENCOUNTER — Encounter (INDEPENDENT_AMBULATORY_CARE_PROVIDER_SITE_OTHER): Payer: Self-pay | Admitting: *Deleted

## 2022-01-14 ENCOUNTER — Encounter (HOSPITAL_COMMUNITY): Payer: Self-pay | Admitting: Gastroenterology

## 2022-01-14 ENCOUNTER — Ambulatory Visit (HOSPITAL_COMMUNITY)
Admission: RE | Admit: 2022-01-14 | Discharge: 2022-01-14 | Disposition: A | Discharge status: Home / Self Care | Payer: BC Managed Care – PPO | Attending: Gastroenterology | Admitting: Gastroenterology

## 2022-01-14 ENCOUNTER — Encounter (HOSPITAL_COMMUNITY)
Admission: RE | Disposition: A | Discharge status: Home / Self Care | Payer: Self-pay | Source: Home / Self Care | Attending: Gastroenterology

## 2022-01-14 DIAGNOSIS — Z79899 Other long term (current) drug therapy: Secondary | ICD-10-CM | POA: Insufficient documentation

## 2022-01-14 DIAGNOSIS — Z8673 Personal history of transient ischemic attack (TIA), and cerebral infarction without residual deficits: Secondary | ICD-10-CM | POA: Insufficient documentation

## 2022-01-14 DIAGNOSIS — K5730 Diverticulosis of large intestine without perforation or abscess without bleeding: Secondary | ICD-10-CM

## 2022-01-14 DIAGNOSIS — Z1211 Encounter for screening for malignant neoplasm of colon: Secondary | ICD-10-CM | POA: Diagnosis not present

## 2022-01-14 DIAGNOSIS — R1010 Upper abdominal pain, unspecified: Secondary | ICD-10-CM

## 2022-01-14 DIAGNOSIS — K2970 Gastritis, unspecified, without bleeding: Secondary | ICD-10-CM

## 2022-01-14 DIAGNOSIS — F17210 Nicotine dependence, cigarettes, uncomplicated: Secondary | ICD-10-CM | POA: Diagnosis not present

## 2022-01-14 DIAGNOSIS — R1012 Left upper quadrant pain: Secondary | ICD-10-CM

## 2022-01-14 DIAGNOSIS — K635 Polyp of colon: Secondary | ICD-10-CM | POA: Diagnosis not present

## 2022-01-14 DIAGNOSIS — D123 Benign neoplasm of transverse colon: Secondary | ICD-10-CM | POA: Diagnosis not present

## 2022-01-14 DIAGNOSIS — D122 Benign neoplasm of ascending colon: Secondary | ICD-10-CM | POA: Insufficient documentation

## 2022-01-14 DIAGNOSIS — Z8249 Family history of ischemic heart disease and other diseases of the circulatory system: Secondary | ICD-10-CM | POA: Insufficient documentation

## 2022-01-14 DIAGNOSIS — I10 Essential (primary) hypertension: Secondary | ICD-10-CM | POA: Insufficient documentation

## 2022-01-14 DIAGNOSIS — K3189 Other diseases of stomach and duodenum: Secondary | ICD-10-CM

## 2022-01-14 DIAGNOSIS — K219 Gastro-esophageal reflux disease without esophagitis: Secondary | ICD-10-CM | POA: Diagnosis not present

## 2022-01-14 DIAGNOSIS — D120 Benign neoplasm of cecum: Secondary | ICD-10-CM | POA: Diagnosis not present

## 2022-01-14 HISTORY — PX: COLONOSCOPY WITH PROPOFOL: SHX5780

## 2022-01-14 HISTORY — PX: ESOPHAGOGASTRODUODENOSCOPY (EGD) WITH PROPOFOL: SHX5813

## 2022-01-14 HISTORY — PX: BIOPSY: SHX5522

## 2022-01-14 HISTORY — PX: POLYPECTOMY: SHX5525

## 2022-01-14 LAB — HM COLONOSCOPY

## 2022-01-14 SURGERY — COLONOSCOPY WITH PROPOFOL
Anesthesia: General

## 2022-01-14 MED ORDER — OMEPRAZOLE 40 MG PO CPDR: 40.0000 mg | DELAYED_RELEASE_CAPSULE | Freq: Every day | ORAL | 0 refills | Status: DC

## 2022-01-14 MED ORDER — SODIUM CHLORIDE 0.9 % IV SOLN: INTRAVENOUS | Status: DC

## 2022-01-14 MED ORDER — LIDOCAINE HCL (CARDIAC) PF 50 MG/5ML IV SOSY
PREFILLED_SYRINGE | INTRAVENOUS | Status: DC | PRN
  Administered 2022-01-14: 100 mg via INTRAVENOUS

## 2022-01-14 MED ORDER — PROPOFOL 500 MG/50ML IV EMUL
INTRAVENOUS | Status: DC | PRN
  Administered 2022-01-14 (×2): 100 ug/kg/min via INTRAVENOUS

## 2022-01-14 MED ORDER — PROPOFOL 10 MG/ML IV BOLUS
INTRAVENOUS | Status: DC | PRN
  Administered 2022-01-14: 40 mg via INTRAVENOUS
  Administered 2022-01-14: 200 mg via INTRAVENOUS
  Administered 2022-01-14: 20 mg via INTRAVENOUS

## 2022-01-14 MED ORDER — LACTATED RINGERS IV SOLN: INTRAVENOUS | Status: DC

## 2022-01-14 NOTE — Discharge Instructions (Addendum)
You are being discharged to home.  Resume your previous diet.  We are waiting for your pathology results.  Restart omeprazole 40 mg qday Your physician has recommended a repeat colonoscopy in two years for surveillance.

## 2022-01-14 NOTE — Anesthesia Postprocedure Evaluation (Signed)
Anesthesia Post Note  Patient: Michael Herring  Procedure(s) Performed: COLONOSCOPY WITH PROPOFOL ESOPHAGOGASTRODUODENOSCOPY (EGD) WITH PROPOFOL BIOPSY POLYPECTOMY  Patient location during evaluation: Phase II Anesthesia Type: General Level of consciousness: awake and alert and oriented Pain management: pain level controlled Vital Signs Assessment: post-procedure vital signs reviewed and stable Respiratory status: spontaneous breathing, nonlabored ventilation and respiratory function stable Cardiovascular status: blood pressure returned to baseline and stable Postop Assessment: no apparent nausea or vomiting Anesthetic complications: no   No notable events documented.   Last Vitals:  Vitals:   01/14/22 0932 01/14/22 0937  BP: (!) 87/56 107/69  Pulse: 66 69  Resp: 20 17  Temp: 36.4 C   SpO2: 96% 95%    Last Pain:  Vitals:   01/14/22 0936  TempSrc:   PainSc: 0-No pain                 Desiderio Dolata C Nyjah Denio

## 2022-01-14 NOTE — Transfer of Care (Signed)
Immediate Anesthesia Transfer of Care Note  Patient: Michael Herring  Procedure(s) Performed: COLONOSCOPY WITH PROPOFOL ESOPHAGOGASTRODUODENOSCOPY (EGD) WITH PROPOFOL BIOPSY POLYPECTOMY  Patient Location: Short Stay  Anesthesia Type:General  Level of Consciousness: sedated  Airway & Oxygen Therapy: Patient Spontanous Breathing  Post-op Assessment: Report given to RN and Post -op Vital signs reviewed and stable  Post vital signs: Reviewed and stable  Last Vitals:  Vitals Value Taken Time  BP 87/569 0935  Temp 97.6 0935  Pulse 59 0935  Resp 15 0935  SpO2 96 0935  0935 0935 Last0 Pain:  Vitals:   01/14/22 0842  TempSrc:   PainSc: 0-No pain         Complications: No notable events documented.

## 2022-01-14 NOTE — Anesthesia Preprocedure Evaluation (Signed)
Anesthesia Evaluation  Patient identified by MRN, date of birth, ID band Patient awake    Reviewed: Allergy & Precautions, NPO status , Patient's Chart, lab work & pertinent test results  History of Anesthesia Complications (+) Family history of anesthesia reaction  Airway Mallampati: II  TM Distance: >3 FB Neck ROM: Full    Dental  (+) Caps, Dental Advisory Given   Pulmonary neg pulmonary ROS, Current Smoker,    Pulmonary exam normal breath sounds clear to auscultation       Cardiovascular Exercise Tolerance: Good hypertension, Pt. on medications Normal cardiovascular exam+ dysrhythmias  Rhythm:Regular Rate:Normal     Neuro/Psych Seizures -,  TIACVA negative psych ROS   GI/Hepatic GERD  Medicated,(+)     substance abuse (h/o heavy alcohol use)  alcohol use,   Endo/Other  negative endocrine ROS  Renal/GU negative Renal ROS  negative genitourinary   Musculoskeletal negative musculoskeletal ROS (+)   Abdominal   Peds negative pediatric ROS (+)  Hematology negative hematology ROS (+)   Anesthesia Other Findings   Reproductive/Obstetrics negative OB ROS                             Anesthesia Physical Anesthesia Plan  ASA: 2  Anesthesia Plan: General   Post-op Pain Management: Minimal or no pain anticipated   Induction: Intravenous  PONV Risk Score and Plan: Propofol infusion  Airway Management Planned: Nasal Cannula and Natural Airway  Additional Equipment:   Intra-op Plan:   Post-operative Plan:   Informed Consent: I have reviewed the patients History and Physical, chart, labs and discussed the procedure including the risks, benefits and alternatives for the proposed anesthesia with the patient or authorized representative who has indicated his/her understanding and acceptance.     Dental advisory given  Plan Discussed with: CRNA and Surgeon  Anesthesia Plan  Comments:         Anesthesia Quick Evaluation

## 2022-01-14 NOTE — Op Note (Addendum)
[facility] Patient Name: Michael Herring Procedure Date: 01/14/2022 8:34 AM MRN: [id number] Date of Birth: 1965-09-01 Attending MD: Maylon Peppers ,  CSN: [id number] Age: 56 Admit Type: Outpatient Procedure:                Colonoscopy Indications:              Screening for colorectal malignant neoplasm Providers:                Maylon Peppers, Lambert Mody, Caprice Kluver,                            Pincus Large, Technician Referring MD:              Medicines:                Monitored Anesthesia Care Complications:            No immediate complications. Estimated Blood Loss:     Estimated blood loss: none. Procedure:                Pre-Anesthesia Assessment:                           - Prior to the procedure, a History and Physical                            was performed, and patient medications, allergies                            and sensitivities were reviewed. The patient's                            tolerance of previous anesthesia was reviewed.                           - The risks and benefits of the procedure and the                            sedation options and risks were discussed with the                            patient. All questions were answered and informed                            consent was obtained.                           - ASA Grade Assessment: II - A patient with mild                            systemic disease.                           After obtaining informed consent, the colonoscope                            was passed under direct vision. Throughout the  procedure, the patient's blood pressure, pulse, and                            oxygen saturations were monitored continuously. The                            [id number]-[id number] ([id number]) scope was introduced through                            the anus and advanced to the the terminal ileum.                            The colonoscopy was performed  without difficulty.                            The patient tolerated the procedure well. The                            quality of the bowel preparation was adequate. Scope In: 8:59:03 AM Scope Out: 9:27:13 AM Scope Withdrawal Time: 0 hours 25 minutes 44 seconds  Total Procedure Duration: 0 hours 28 minutes 10 seconds  Findings:      The perianal and digital rectal examinations were normal.      A 2 mm polyp was found in the ileocecal valve. The polyp was sessile.       The polyp was removed with a cold biopsy forceps. Resection and       retrieval were complete.      Eight sessile polyps were found in the transverse colon, ascending colon       and cecum. The polyps were 3 to 6 mm in size. These polyps were removed       with a cold snare. Resection and retrieval were complete.      A 1 mm polyp was found in the transverse colon. The polyp was sessile.       The polyp was removed with a cold biopsy forceps. Resection and       retrieval were complete.      A single small-mouthed diverticulum was found in the sigmoid colon.      The retroflexed view of the distal rectum and anal verge was normal and       showed no anal or rectal abnormalities.      The terminal ileum appeared normal. Impression:               - One 2 mm polyp at the ileocecal valve, removed                            with a cold biopsy forceps. Resected and retrieved.                           - Eight 3 to 6 mm polyps in the transverse colon,                            in the ascending colon and in the cecum, removed  with a cold snare. Resected and retrieved.                           - One 1 mm polyp in the transverse colon, removed                            with a cold biopsy forceps. Resected and retrieved.                           - Diverticulosis in the sigmoid colon.                           - The distal rectum and anal verge are normal on                            retroflexion  view.                           - The examined portion of the ileum was normal. Moderate Sedation:      Per Anesthesia Care Recommendation:           - Discharge patient to home (ambulatory).                           - Resume previous diet.                           - Await pathology results.                           - Repeat colonoscopy in 2 years for surveillance. Procedure Code(s):        --- Professional ---                           [phone], Colonoscopy, flexible; with removal of                            tumor(s), polyp(s), or other lesion(s) by snare                            technique                           45380, 59, Colonoscopy, flexible; with biopsy,                            single or multiple Diagnosis Code(s):        --- Professional ---                           K63.5, Polyp of colon                           Z12.11, Encounter for screening for malignant  neoplasm of colon                           K57.30, Diverticulosis of large intestine without                            perforation or abscess without bleeding CPT copyright 2019 American Medical Association. All rights reserved. The codes documented in this report are preliminary and upon coder review may  be revised to meet current compliance requirements. Maylon Peppers, MD Maylon Peppers,  01/14/2022 9:33:53 AM This report has been signed electronically. Number of Addenda: 0
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
## 2022-01-14 NOTE — Anesthesia Procedure Notes (Signed)
Date/Time: 01/14/2022 8:41 AM  Performed by: Vista Deck, CRNAPre-anesthesia Checklist: Patient identified, Emergency Drugs available, Suction available, Timeout performed and Patient being monitored Patient Re-evaluated:Patient Re-evaluated prior to induction Oxygen Delivery Method: Nasal Cannula

## 2022-01-14 NOTE — Op Note (Addendum)
[facility] Patient Name: Michael Herring Procedure Date: 01/14/2022 8:31 AM MRN: [id number] Date of Birth: 25-Oct-1965 Attending MD: Maylon Peppers ,  CSN: [id number] Age: 56 Admit Type: Outpatient Procedure:                Upper GI endoscopy Indications:              Upper abdominal pain Providers:                Maylon Peppers, Lambert Mody, Caprice Kluver,                            Pincus Large, Technician Referring MD:              Medicines:                Monitored Anesthesia Care Complications:            No immediate complications. Estimated Blood Loss:     Estimated blood loss: none. Procedure:                Pre-Anesthesia Assessment:                           - Prior to the procedure, a History and Physical                            was performed, and patient medications, allergies                            and sensitivities were reviewed. The patient's                            tolerance of previous anesthesia was reviewed.                           - The risks and benefits of the procedure and the                            sedation options and risks were discussed with the                            patient. All questions were answered and informed                            consent was obtained.                           - ASA Grade Assessment: II - A patient with mild                            systemic disease.                           After obtaining informed consent, the endoscope was                            passed under direct vision. Throughout the  procedure, the patient's blood pressure, pulse, and                            oxygen saturations were monitored continuously. The                            [id number]-H190 ([id number]) scope was introduced through the                            mouth, and advanced to the second part of duodenum.                            The upper GI endoscopy was accomplished  without                            difficulty. The patient tolerated the procedure                            well. Scope In: 8:47:25 AM Scope Out: 8:53:32 AM Total Procedure Duration: 0 hours 6 minutes 7 seconds  Findings:      The examined esophagus was normal.      The gastroesophageal flap valve was visualized endoscopically and       classified as Hill Grade II (fold present, opens with respiration).      Localized mild inflammation characterized by erosions and erythema was       found in the gastric antrum. Biopsies were taken with a cold forceps for       Helicobacter pylori testing.      Localized moderately erythematous mucosa without active bleeding was       found in the first portion of the duodenum. Impression:               - Normal esophagus.                           - Gastritis. Biopsied.                           - Erythematous duodenopathy. Moderate Sedation:      Per Anesthesia Care Recommendation:           - Discharge patient to home (ambulatory).                           - Resume previous diet.                           - Await pathology results.                           - Restart omeprazole 40 mg qday Procedure Code(s):        --- Professional ---                           [phone], Esophagogastroduodenoscopy, flexible,                            transoral; with biopsy, single  or multiple Diagnosis Code(s):        --- Professional ---                           K29.70, Gastritis, unspecified, without bleeding                           K31.89, Other diseases of stomach and duodenum                           R10.10, Upper abdominal pain, unspecified CPT copyright 2019 [employer] Association. All rights reserved. The codes documented in this report are preliminary and upon coder review may  be revised to meet current compliance requirements. Maylon Peppers, MD Maylon Peppers,  01/14/2022 8:57:34 AM This report has been signed electronically. Number  of Addenda: 0

## 2022-01-15 LAB — H. PYLORI ANTIBODY, IGG: H Pylori IgG: 0.11 Index Value (ref 0.00–0.79)

## 2022-01-15 LAB — SURGICAL PATHOLOGY

## 2022-01-20 ENCOUNTER — Encounter (HOSPITAL_COMMUNITY): Payer: Self-pay | Admitting: Gastroenterology

## 2022-01-30 ENCOUNTER — Other Ambulatory Visit: Payer: Self-pay | Admitting: Nurse Practitioner

## 2022-01-30 DIAGNOSIS — I10 Essential (primary) hypertension: Secondary | ICD-10-CM

## 2022-02-02 ENCOUNTER — Other Ambulatory Visit: Payer: Self-pay | Admitting: Nurse Practitioner

## 2022-02-02 NOTE — Telephone Encounter (Signed)
Last office visit 12/17/21 Last refill 09/14/21, #30, 1 refill

## 2022-02-09 ENCOUNTER — Encounter (INDEPENDENT_AMBULATORY_CARE_PROVIDER_SITE_OTHER): Payer: Self-pay | Admitting: Gastroenterology

## 2022-02-18 ENCOUNTER — Ambulatory Visit (INDEPENDENT_AMBULATORY_CARE_PROVIDER_SITE_OTHER): Payer: BC Managed Care – PPO | Admitting: Gastroenterology

## 2022-03-19 ENCOUNTER — Ambulatory Visit: Payer: BC Managed Care – PPO | Admitting: Nurse Practitioner

## 2022-04-11 ENCOUNTER — Other Ambulatory Visit (INDEPENDENT_AMBULATORY_CARE_PROVIDER_SITE_OTHER): Payer: Self-pay | Admitting: Gastroenterology

## 2022-04-22 ENCOUNTER — Ambulatory Visit (INDEPENDENT_AMBULATORY_CARE_PROVIDER_SITE_OTHER): Payer: BC Managed Care – PPO | Admitting: Gastroenterology

## 2022-05-30 ENCOUNTER — Other Ambulatory Visit: Payer: Self-pay | Admitting: Nurse Practitioner

## 2022-05-30 DIAGNOSIS — I10 Essential (primary) hypertension: Secondary | ICD-10-CM

## 2022-06-14 ENCOUNTER — Other Ambulatory Visit: Payer: Self-pay | Admitting: Nurse Practitioner

## 2022-06-14 DIAGNOSIS — I10 Essential (primary) hypertension: Secondary | ICD-10-CM

## 2022-07-17 ENCOUNTER — Other Ambulatory Visit (INDEPENDENT_AMBULATORY_CARE_PROVIDER_SITE_OTHER): Payer: Self-pay | Admitting: Gastroenterology

## 2022-07-26 ENCOUNTER — Other Ambulatory Visit: Payer: Self-pay | Admitting: Nurse Practitioner

## 2022-07-26 DIAGNOSIS — I10 Essential (primary) hypertension: Secondary | ICD-10-CM

## 2022-07-26 NOTE — Telephone Encounter (Signed)
MMM NTBS 30 days given 06/23/22

## 2022-07-27 MED ORDER — LISINOPRIL-HYDROCHLOROTHIAZIDE 20-12.5 MG PO TABS: 1.0000 | ORAL_TABLET | Freq: Every day | ORAL | 0 refills | Status: DC

## 2022-07-27 NOTE — Addendum Note (Signed)
Addended by: Antonietta Barcelona D on: 07/27/2022 02:25 PM   Modules accepted: Orders

## 2022-07-27 NOTE — Telephone Encounter (Signed)
Appt with MMM 08-05-2022

## 2022-08-05 ENCOUNTER — Encounter: Payer: Self-pay | Admitting: Nurse Practitioner

## 2022-08-05 ENCOUNTER — Ambulatory Visit (INDEPENDENT_AMBULATORY_CARE_PROVIDER_SITE_OTHER): Payer: BLUE CROSS/BLUE SHIELD | Admitting: Nurse Practitioner

## 2022-08-05 VITALS — BP 143/86 | HR 74 | Temp 98.1°F | Resp 20 | Ht 70.0 in | Wt 230.0 lb

## 2022-08-05 DIAGNOSIS — I10 Essential (primary) hypertension: Secondary | ICD-10-CM | POA: Diagnosis not present

## 2022-08-05 DIAGNOSIS — N400 Enlarged prostate without lower urinary tract symptoms: Secondary | ICD-10-CM | POA: Diagnosis not present

## 2022-08-05 DIAGNOSIS — Z23 Encounter for immunization: Secondary | ICD-10-CM | POA: Diagnosis not present

## 2022-08-05 DIAGNOSIS — G459 Transient cerebral ischemic attack, unspecified: Secondary | ICD-10-CM | POA: Diagnosis not present

## 2022-08-05 DIAGNOSIS — F17200 Nicotine dependence, unspecified, uncomplicated: Secondary | ICD-10-CM | POA: Diagnosis not present

## 2022-08-05 LAB — LIPID PANEL

## 2022-08-05 MED ORDER — OMEPRAZOLE 40 MG PO CPDR: 40.0000 mg | DELAYED_RELEASE_CAPSULE | Freq: Every day | ORAL | 1 refills | Status: DC

## 2022-08-05 MED ORDER — LISINOPRIL-HYDROCHLOROTHIAZIDE 20-12.5 MG PO TABS: 1.0000 | ORAL_TABLET | Freq: Every day | ORAL | 1 refills | Status: DC

## 2022-08-05 NOTE — Patient Instructions (Signed)
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
## 2022-08-05 NOTE — Addendum Note (Signed)
Addended by: Rolena Infante on: 08/05/2022 03:33 PM   Modules accepted: Orders

## 2022-08-06 LAB — CBC WITH DIFFERENTIAL/PLATELET
Basophils Absolute: 0.1 10*3/uL (ref 0.0–0.2)
Basos: 1 %
EOS (ABSOLUTE): 0.3 10*3/uL (ref 0.0–0.4)
Eos: 4 %
Hematocrit: 50.6 % (ref 37.5–51.0)
Hemoglobin: 17.2 g/dL (ref 13.0–17.7)
Immature Grans (Abs): 0 10*3/uL (ref 0.0–0.1)
Immature Granulocytes: 0 %
Lymphocytes Absolute: 1.9 10*3/uL (ref 0.7–3.1)
Lymphs: 25 %
MCH: 31.2 pg (ref 26.6–33.0)
MCHC: 34 g/dL (ref 31.5–35.7)
MCV: 92 fL (ref 79–97)
Monocytes Absolute: 0.7 10*3/uL (ref 0.1–0.9)
Monocytes: 9 %
Neutrophils Absolute: 4.7 10*3/uL (ref 1.4–7.0)
Neutrophils: 61 %
Platelets: 210 10*3/uL (ref 150–450)
RBC: 5.51 x10E6/uL (ref 4.14–5.80)
RDW: 12.5 % (ref 11.6–15.4)
WBC: 7.7 10*3/uL (ref 3.4–10.8)

## 2022-08-06 LAB — PSA, TOTAL AND FREE
PSA, Free Pct: 38.3 %
PSA, Free: 0.23 ng/mL
Prostate Specific Ag, Serum: 0.6 ng/mL (ref 0.0–4.0)

## 2022-08-06 LAB — LIPID PANEL
Chol/HDL Ratio: 2.4 ratio (ref 0.0–5.0)
Cholesterol, Total: 78 mg/dL — ABNORMAL LOW (ref 100–199)
HDL: 33 mg/dL — ABNORMAL LOW (ref >39)
LDL Chol Calc (NIH): 30 mg/dL (ref 0–99)
Triglycerides: 66 mg/dL (ref 0–149)
VLDL Cholesterol Cal: 15 mg/dL (ref 5–40)

## 2022-08-06 LAB — CMP14+EGFR
ALT: 33 IU/L (ref 0–44)
AST: 19 IU/L (ref 0–40)
Albumin/Globulin Ratio: 1.9 (ref 1.2–2.2)
Albumin: 4.2 g/dL (ref 3.8–4.9)
Alkaline Phosphatase: 73 IU/L (ref 44–121)
BUN/Creatinine Ratio: 11 (ref 9–20)
BUN: 12 mg/dL (ref 6–24)
Bilirubin Total: 0.6 mg/dL (ref 0.0–1.2)
CO2: 22 mmol/L (ref 20–29)
Calcium: 9.1 mg/dL (ref 8.7–10.2)
Chloride: 102 mmol/L (ref 96–106)
Creatinine, Ser: 1.11 mg/dL (ref 0.76–1.27)
Globulin, Total: 2.2 g/dL (ref 1.5–4.5)
Glucose: 124 mg/dL — ABNORMAL HIGH (ref 70–99)
Potassium: 4 mmol/L (ref 3.5–5.2)
Sodium: 139 mmol/L (ref 134–144)
Total Protein: 6.4 g/dL (ref 6.0–8.5)
eGFR: 78 mL/min/{1.73_m2} (ref >59)

## 2022-08-09 ENCOUNTER — Encounter: Payer: Self-pay | Admitting: Nurse Practitioner

## 2022-08-09 DIAGNOSIS — E8881 Metabolic syndrome: Secondary | ICD-10-CM | POA: Insufficient documentation

## 2022-08-09 LAB — SPECIMEN STATUS REPORT

## 2022-08-09 LAB — HGB A1C W/O EAG: Hgb A1c MFr Bld: 5.9 % — ABNORMAL HIGH (ref 4.8–5.6)

## 2023-02-02 ENCOUNTER — Other Ambulatory Visit: Payer: Self-pay | Admitting: Nurse Practitioner

## 2023-02-07 ENCOUNTER — Ambulatory Visit (INDEPENDENT_AMBULATORY_CARE_PROVIDER_SITE_OTHER): Payer: BLUE CROSS/BLUE SHIELD | Admitting: Nurse Practitioner

## 2023-02-07 ENCOUNTER — Ambulatory Visit (INDEPENDENT_AMBULATORY_CARE_PROVIDER_SITE_OTHER): Payer: BLUE CROSS/BLUE SHIELD

## 2023-02-07 ENCOUNTER — Encounter: Payer: Self-pay | Admitting: Nurse Practitioner

## 2023-02-07 VITALS — BP 142/87 | HR 79 | Temp 98.0°F | Resp 20 | Ht 70.0 in | Wt 229.0 lb

## 2023-02-07 DIAGNOSIS — N400 Enlarged prostate without lower urinary tract symptoms: Secondary | ICD-10-CM

## 2023-02-07 DIAGNOSIS — F17200 Nicotine dependence, unspecified, uncomplicated: Secondary | ICD-10-CM

## 2023-02-07 DIAGNOSIS — I10 Essential (primary) hypertension: Secondary | ICD-10-CM

## 2023-02-07 DIAGNOSIS — G459 Transient cerebral ischemic attack, unspecified: Secondary | ICD-10-CM | POA: Diagnosis not present

## 2023-02-07 DIAGNOSIS — E8881 Metabolic syndrome: Secondary | ICD-10-CM | POA: Diagnosis not present

## 2023-02-07 LAB — CMP14+EGFR
ALT: 28 [IU]/L (ref 0–44)
AST: 22 [IU]/L (ref 0–40)
Albumin: 4 g/dL (ref 3.8–4.9)
Alkaline Phosphatase: 73 [IU]/L (ref 44–121)
BUN/Creatinine Ratio: 11 (ref 9–20)
BUN: 14 mg/dL (ref 6–24)
Bilirubin Total: 0.6 mg/dL (ref 0.0–1.2)
CO2: 23 mmol/L (ref 20–29)
Calcium: 8.8 mg/dL (ref 8.7–10.2)
Chloride: 107 mmol/L — ABNORMAL HIGH (ref 96–106)
Creatinine, Ser: 1.25 mg/dL (ref 0.76–1.27)
Globulin, Total: 2 g/dL (ref 1.5–4.5)
Glucose: 104 mg/dL — ABNORMAL HIGH (ref 70–99)
Potassium: 3.7 mmol/L (ref 3.5–5.2)
Sodium: 144 mmol/L (ref 134–144)
Total Protein: 6 g/dL (ref 6.0–8.5)
eGFR: 68 mL/min/{1.73_m2} (ref >59)

## 2023-02-07 LAB — LIPID PANEL
Chol/HDL Ratio: 2.5 {ratio} (ref 0.0–5.0)
Cholesterol, Total: 74 mg/dL — ABNORMAL LOW (ref 100–199)
HDL: 30 mg/dL — ABNORMAL LOW (ref >39)
LDL Chol Calc (NIH): 24 mg/dL (ref 0–99)
Triglycerides: 106 mg/dL (ref 0–149)
VLDL Cholesterol Cal: 20 mg/dL (ref 5–40)

## 2023-02-07 LAB — CBC WITH DIFFERENTIAL/PLATELET
Basophils Absolute: 0.1 10*3/uL (ref 0.0–0.2)
Basos: 1 %
EOS (ABSOLUTE): 0.3 10*3/uL (ref 0.0–0.4)
Eos: 4 %
Hematocrit: 48.5 % (ref 37.5–51.0)
Hemoglobin: 15.8 g/dL (ref 13.0–17.7)
Immature Grans (Abs): 0 10*3/uL (ref 0.0–0.1)
Immature Granulocytes: 0 %
Lymphocytes Absolute: 2.9 10*3/uL (ref 0.7–3.1)
Lymphs: 38 %
MCH: 30.1 pg (ref 26.6–33.0)
MCHC: 32.6 g/dL (ref 31.5–35.7)
MCV: 92 fL (ref 79–97)
Monocytes Absolute: 0.7 10*3/uL (ref 0.1–0.9)
Monocytes: 9 %
Neutrophils Absolute: 3.7 10*3/uL (ref 1.4–7.0)
Neutrophils: 48 %
Platelets: 224 10*3/uL (ref 150–450)
RBC: 5.25 x10E6/uL (ref 4.14–5.80)
RDW: 13.3 % (ref 11.6–15.4)
WBC: 7.6 10*3/uL (ref 3.4–10.8)

## 2023-02-07 LAB — BAYER DCA HB A1C WAIVED: HB A1C (BAYER DCA - WAIVED): 5.9 % — ABNORMAL HIGH (ref 4.8–5.6)

## 2023-02-07 MED ORDER — LISINOPRIL-HYDROCHLOROTHIAZIDE 20-12.5 MG PO TABS
1.0000 | ORAL_TABLET | Freq: Every day | ORAL | 1 refills | Status: AC
Start: 2023-02-07 — End: ?

## 2023-02-07 MED ORDER — OMEPRAZOLE 40 MG PO CPDR: 40.0000 mg | DELAYED_RELEASE_CAPSULE | Freq: Every day | ORAL | 1 refills | Status: AC

## 2023-02-07 NOTE — Patient Instructions (Signed)
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
## 2023-08-05 ENCOUNTER — Ambulatory Visit: Payer: BLUE CROSS/BLUE SHIELD | Admitting: Nurse Practitioner

## 2023-12-14 ENCOUNTER — Encounter (INDEPENDENT_AMBULATORY_CARE_PROVIDER_SITE_OTHER): Payer: Self-pay | Admitting: *Deleted

## 2024-02-15 ENCOUNTER — Encounter (INDEPENDENT_AMBULATORY_CARE_PROVIDER_SITE_OTHER): Payer: Self-pay | Admitting: Gastroenterology
